# Patient Record
Sex: Male | Born: 1980 | Race: White | Hispanic: No | Marital: Married | Smoking: Current some day smoker
Health system: Southern US, Community
[De-identification: ages and names within clinical notes are randomized; demographics above are authoritative.]

## PROBLEM LIST (undated history)

## (undated) HISTORY — PX: VASECTOMY: SHX75

---

## 2009-04-06 ENCOUNTER — Ambulatory Visit: Payer: Self-pay | Admitting: Family Medicine

## 2009-04-06 DIAGNOSIS — J3089 Other allergic rhinitis: Secondary | ICD-10-CM

## 2009-04-06 DIAGNOSIS — J0100 Acute maxillary sinusitis, unspecified: Secondary | ICD-10-CM | POA: Insufficient documentation

## 2010-01-03 ENCOUNTER — Ambulatory Visit: Payer: Self-pay | Admitting: Occupational Medicine

## 2010-01-03 DIAGNOSIS — H01009 Unspecified blepharitis unspecified eye, unspecified eyelid: Secondary | ICD-10-CM | POA: Insufficient documentation

## 2010-09-19 ENCOUNTER — Ambulatory Visit: Payer: Self-pay | Admitting: Emergency Medicine

## 2010-11-29 NOTE — Assessment & Plan Note (Signed)
Summary: POSSIBLE SINUS INFECTION   Vital Signs:  Patient Profile:   30 Years Old Male CC:      Cold & URI symptoms Height:     73 inches Weight:      232 pounds O2 Sat:      97 % O2 treatment:    Room Air Temp:     98.5 degrees F oral Pulse rate:   93 / minute Pulse rhythm:   irregular Resp:     16 per minute BP sitting:   96 / 63  (left arm) Cuff size:   regular  Vitals Entered By: Areta Haber CMA (September 19, 2010 1:56 PM)                  Current Allergies: No known allergies History of Present Illness History from: patient Chief Complaint: Cold & URI symptoms History of Present Illness: Patient complains of onset of cold symptoms for 3 weeks, but worsening last 4 days.  They have been using Mucinex, Rob-DM, and Goody powder which is helping a little bit.  He is also asking to get back on his Advair and Albuterol since he's been having asthma and allergy symptoms since he moved to [place]. + sore throat No cough No pleuritic pain + wheezing + nasal congestion + post-nasal drainage + sinus pain/pressure No itchy/red eyes No earache No hemoptysis + SOB No chills/sweats No fever No nausea No vomiting No abdominal pain No diarrhea No skin rashes No fatigue No myalgias No headache   Current Problems: BLEPHARITIS, RIGHT (ICD-373.00) ACUTE MAXILLARY SINUSITIS (ICD-461.0) ALLERGIC RHINITIS (ICD-477.9)   Current Meds IBUPROFEN 200 MG TABS (IBUPROFEN) as directed TYLENOL 325 MG TABS (ACETAMINOPHEN) prn ROBITUSSIN MAXIMUM STRENGTH 15 MG/5ML SYRP (DEXTROMETHORPHAN HBR) as directed MUCINEX DM 30-600 MG XR12H-TAB (DEXTROMETHORPHAN-GUAIFENESIN) as directed GOODYS EXTRA STRENGTH 260-130-16.25 MG TABS (ASPIRIN-ACETAMINOPHEN-CAFFEINE) as directed VITAMIN C 1000 MG TABS (ASCORBIC ACID) as directed AMOXICILLIN 875 MG TABS (AMOXICILLIN) 1 tab by mouth two times a day for 10 days PREDNISONE (PAK) 10 MG TABS (PREDNISONE) use as directed (6 day  pack) CHERATUSSIN AC 100-10 MG/5ML SYRP (GUAIFENESIN-CODEINE) 1 tsp by mouth Q6 hrs as needed for cough VENTOLIN HFA 108 (90 BASE) MCG/ACT AERS (ALBUTEROL SULFATE) 1-2 puffs Q6 hours as needed for wheezing, shortness of breath ADVAIR DISKUS 250-50 MCG/DOSE AEPB (FLUTICASONE-SALMETEROL) 1 puff BID  REVIEW OF SYSTEMS Constitutional Symptoms       Complains of fever.     Denies chills, night sweats, weight loss, weight gain, and fatigue.  Eyes       Denies change in vision, eye pain, eye discharge, glasses, contact lenses, and eye surgery. Ear/Nose/Throat/Mouth       Complains of frequent runny nose, sinus problems, sore throat, and hoarseness.      Denies hearing loss/aids, change in hearing, ear pain, ear discharge, dizziness, frequent nose bleeds, and tooth pain or bleeding.      Comments: x 4 dys  Respiratory       Denies dry cough, productive cough, wheezing, shortness of breath, asthma, bronchitis, and emphysema/COPD.      Comments: chest congestion Cardiovascular       Denies murmurs, chest pain, and tires easily with exhertion.    Gastrointestinal       Complains of nausea/vomiting.      Denies stomach pain, diarrhea, constipation, blood in bowel movements, and indigestion. Genitourniary       Denies painful urination, kidney stones, and loss of urinary control. Neurological  Complains of headaches.      Denies paralysis, seizures, and fainting/blackouts. Musculoskeletal       Complains of muscle pain and joint pain.      Denies joint stiffness, decreased range of motion, redness, swelling, muscle weakness, and gout.  Skin       Denies bruising, unusual mles/lumps or sores, and hair/skin or nail changes.  Psych       Denies mood changes, temper/anger issues, anxiety/stress, speech problems, depression, and sleep problems. Other Comments: Pt does not have PCP. Another card given.   Past History:  Past Medical History: Last updated: 04/06/2009 Allergic rhinitis  Past  Surgical History: Last updated: 04/06/2009 Denies surgical history  Family History: Last updated: 04/06/2009 Allergies  Social History: Last updated: 04/06/2009 Married Never Smoked - dip 1 can daily Alcohol use-yes - six pack every other week Drug use-no Regular exercise-yes  Risk Factors: Exercise: yes (04/06/2009)  Risk Factors: Smoking Status: never (04/06/2009) Physical Exam General appearance: well developed, well nourished, no acute distress Ears: normal, no lesions or deformities Nasal: mucosa pink, nonedematous, no septal deviation, turbinates normal Oral/Pharynx: pharyngeal erythema without exudate, uvula midline without deviation Neck: neck supple,  trachea midline, no masses Chest/Lungs: no rales, wheezes, or rhonchi bilateral, breath sounds equal without effort Heart: regular rate and  rhythm, no murmur Skin: no obvious rashes or lesions MSE: oriented to time, place, and person  Patient Education: Patient and/or caregiver instructed in the following: rest, fluids.  Plan New Medications/Changes: ADVAIR DISKUS 250-50 MCG/DOSE AEPB (FLUTICASONE-SALMETEROL) 1 puff BID  #1 x 1, 09/19/2010, Hoyt Koch MD VENTOLIN HFA 108 (90 BASE) MCG/ACT AERS (ALBUTEROL SULFATE) 1-2 puffs Q6 hours as needed for wheezing, shortness of breath  #1 x 1, 09/19/2010, Hoyt Koch MD CHERATUSSIN AC 100-10 MG/5ML SYRP (GUAIFENESIN-CODEINE) 1 tsp by mouth Q6 hrs as needed for cough  #6oz x 0, 09/19/2010, Hoyt Koch MD PREDNISONE (PAK) 10 MG TABS (PREDNISONE) use as directed (6 day pack)  #1 pack x 0, 09/19/2010, Hoyt Koch MD AMOXICILLIN 875 MG TABS (AMOXICILLIN) 1 tab by mouth two times a day for 10 days  #20 x 0, 09/19/2010, Hoyt Koch MD  New Orders: Est. Patient Level IV [16109] Planning Comments:   Rx for Prednisone + Amoxicillin + Cheratussin for the current illness.  Also should take Sudafed 12-hour for the next few days to help control his  symptoms.  He should improve over the next week.   I have also given him 2 months worth of Advair + Albuterol.  However since this is a chronic medical problem, he should be followed by a PCP.  I have given him info about finding a new PCP to manage these problems.   The patient and/or caregiver has been counseled thoroughly with regard to medications prescribed including dosage, schedule, interactions, rationale for use, and possible side effects and they verbalize understanding.  Diagnoses and expected course of recovery discussed and will return if not improved as expected or if the condition worsens. Patient and/or caregiver verbalized understanding.  Prescriptions: ADVAIR DISKUS 250-50 MCG/DOSE AEPB (FLUTICASONE-SALMETEROL) 1 puff BID  #1 x 1   Entered and Authorized by:   Hoyt Koch MD   Signed by:   Hoyt Koch MD on 09/19/2010   Method used:   Print then Give to Patient   RxID:   [id number] VENTOLIN HFA 108 (90 BASE) MCG/ACT AERS (ALBUTEROL SULFATE) 1-2 puffs Q6 hours as needed for wheezing, shortness of breath  #1 x 1   Entered  and Authorized by:   Hoyt Koch MD   Signed by:   Hoyt Koch MD on 09/19/2010   Method used:   Print then Give to Patient   RxID:   [id number] CHERATUSSIN AC 100-10 MG/5ML SYRP (GUAIFENESIN-CODEINE) 1 tsp by mouth Q6 hrs as needed for cough  #6oz x 0   Entered and Authorized by:   Hoyt Koch MD   Signed by:   Hoyt Koch MD on 09/19/2010   Method used:   Print then Give to Patient   RxID:   [id number] PREDNISONE (PAK) 10 MG TABS (PREDNISONE) use as directed (6 day pack)  #1 pack x 0   Entered and Authorized by:   Hoyt Koch MD   Signed by:   Hoyt Koch MD on 09/19/2010   Method used:   Print then Give to Patient   RxID:   [id number] AMOXICILLIN 875 MG TABS (AMOXICILLIN) 1 tab by mouth two times a day for 10 days  #20 x 0   Entered and Authorized by:   Hoyt Koch MD    Signed by:   Hoyt Koch MD on 09/19/2010   Method used:   Print then Give to Patient   RxID:   [id number]   Orders Added: 1)  Est. Patient Level IV [26712]

## 2010-11-29 NOTE — Assessment & Plan Note (Signed)
Summary: EYE PAIN/KH   Vital Signs:  Patient Profile:   30 Years Old Male CC:      Right eye redness and pain x 4 days Height:     73 inches Weight:      235 pounds O2 Sat:      98 % O2 treatment:    Room Air Temp:     98.2 degrees F oral Pulse rate:   59 / minute Pulse rhythm:   regular Resp:     20 per minute BP sitting:   140 / 73  (right arm) Cuff size:   regular  Vitals Entered By: Emilio Math (January 03, 2010 9:07 AM)              Vision Screening: Left eye w/o correction: 20 / 20 Right Eye w/o correction: 20 / 40 Both eyes w/o correction:  20/ 20          Current Allergies (reviewed today): No known allergies History of Present Illness Chief Complaint: Right eye redness and pain x 4 days History of Present Illness: Presents with complaints of mildly painful irritated upper eyelid on the right.   He has crusting in the mornings from his right eye.  No conjunctival injection.   No vision changes.   No allergy symptoms.    REVIEW OF SYSTEMS Constitutional Symptoms      Denies fever, chills, night sweats, weight loss, weight gain, and fatigue.  Eyes       Complains of change in vision, eye pain, eye drainage, and glasses.      Denies contact lenses and eye surgery. Ear/Nose/Throat/Mouth       Denies hearing loss/aids, change in hearing, ear pain, ear discharge, dizziness, frequent runny nose, frequent nose bleeds, sinus problems, sore throat, hoarseness, and tooth pain or bleeding.  Respiratory       Denies dry cough, productive cough, wheezing, shortness of breath, asthma, bronchitis, and emphysema/COPD.  Cardiovascular       Denies murmurs, chest pain, and tires easily with exhertion.    Gastrointestinal       Denies stomach pain, nausea/vomiting, diarrhea, constipation, blood in bowel movements, and indigestion. Genitourniary       Denies painful urination, kidney stones, and loss of urinary control. Neurological       Denies paralysis, seizures, and  fainting/blackouts. Musculoskeletal       Denies muscle pain, joint pain, joint stiffness, decreased range of motion, redness, swelling, muscle weakness, and gout.  Skin       Denies bruising, unusual mles/lumps or sores, and hair/skin or nail changes.  Psych       Denies mood changes, temper/anger issues, anxiety/stress, speech problems, depression, and sleep problems.  Past History:  Past Medical History: Reviewed history from 04/06/2009 and no changes required. Allergic rhinitis  Past Surgical History: Reviewed history from 04/06/2009 and no changes required. Denies surgical history  Family History: Reviewed history from 04/06/2009 and no changes required. Allergies  Social History: Reviewed history from 04/06/2009 and no changes required. Married Never Smoked - dip 1 can daily Alcohol use-yes - six pack every other week Drug use-no Regular exercise-yes Physical Exam General appearance: well developed, well nourished, no acute distress Eyes: normal conjuctival and cornea.   Swollen uper eyelid with yellow crust.   No evidence of stye.   Pupils: equal, round, reactive to light Ears: normal, no lesions or deformities Chest/Lungs: no rales, wheezes, or rhonchi bilateral, breath sounds equal without effort Heart: regular rate  and  rhythm, no murmur Assessment New Problems: BLEPHARITIS, RIGHT (ICD-373.00)   Plan New Medications/Changes: BACITRACIN 500 UNIT/GM OINT (BACITRACIN) apply a ribbon of ointment in lower lid twice a day for 7 days for eye infection.  #1 tube x 0, 01/03/2010, Kathrine Haddock MD  New Orders: Est. Patient Level III [phone] Planning Comments:   eye hygine instructions given Bacitracin ointment to right eye twice a day for 1 week Follow up if no better in 5 days.   The patient and/or caregiver has been counseled thoroughly with regard to medications prescribed including dosage, schedule, interactions, rationale for use, and possible side effects  and they verbalize understanding.  Diagnoses and expected course of recovery discussed and will return if not improved as expected or if the condition worsens. Patient and/or caregiver verbalized understanding.  Prescriptions: BACITRACIN 500 UNIT/GM OINT (BACITRACIN) apply a ribbon of ointment in lower lid twice a day for 7 days for eye infection.  #1 tube x 0   Entered and Authorized by:   Kathrine Haddock MD   Signed by:   Kathrine Haddock MD on 01/03/2010   Method used:   Electronically to        [facility]  [address] [id number] (retail)       [address]       [place], [place]  [address]       Ph: [phone] or [phone]       Fax: [phone]   RxID:   [id number]

## 2011-08-17 ENCOUNTER — Inpatient Hospital Stay (INDEPENDENT_AMBULATORY_CARE_PROVIDER_SITE_OTHER)
Admission: RE | Admit: 2011-08-17 | Discharge: 2011-08-17 | Disposition: A | Discharge status: Home / Self Care | Payer: BC Managed Care – PPO | Source: Ambulatory Visit | Attending: Family Medicine | Admitting: Family Medicine

## 2011-08-17 ENCOUNTER — Encounter: Payer: Self-pay | Admitting: Family Medicine

## 2011-08-17 DIAGNOSIS — J069 Acute upper respiratory infection, unspecified: Secondary | ICD-10-CM

## 2011-08-17 DIAGNOSIS — H6690 Otitis media, unspecified, unspecified ear: Secondary | ICD-10-CM

## 2011-10-02 NOTE — Progress Notes (Signed)
Summary: COUGH,SOB,HEADACHE,SORE THROAT...WSE Room 4   Vital Signs:  Patient Profile:   30 Years Old Male CC:      Dry cough, congestion, fatigue, fever x 5 days Height:     73 inches Weight:      233 pounds O2 Sat:      98 % O2 treatment:    Room Air Temp:     99.0 degrees F oral Pulse rate:   70 / minute Pulse rhythm:   regular Resp:     16 per minute BP sitting:   133 / 77  (left arm) Cuff size:   regular  Vitals Entered By: Emilio Math (August 17, 2011 3:05 PM)                  Current Allergies: No known allergies History of Present Illness Chief Complaint: Dry cough, congestion, fatigue, fever x 5 days History of Present Illness:  Subjective: Patient complains of URI symptoms that started 4 days ago. + mild sore throat, improved + cough for two days, non-productive No pleuritic pain No wheezing + nasal congestion + post-nasal drainage No sinus pain/pressure No itchy/red eyes ? left earache No hemoptysis No SOB + fever/chills No nausea No vomiting No abdominal pain No diarrhea No skin rashes +fatigue No myalgias No headache Used OTC meds without relief   Current Meds IBUPROFEN 200 MG TABS (IBUPROFEN) as directed TYLENOL 325 MG TABS (ACETAMINOPHEN) prn GOODYS EXTRA STRENGTH 260-130-16.25 MG TABS (ASPIRIN-ACETAMINOPHEN-CAFFEINE) as directed VITAMIN C 1000 MG TABS (ASCORBIC ACID) as directed AMOXICILLIN 875 MG TABS (AMOXICILLIN) One by mouth two times a day BENZONATATE 200 MG CAPS (BENZONATATE) One by mouth hs as needed cough  REVIEW OF SYSTEMS Constitutional Symptoms       Complains of fever and fatigue.     Denies chills, night sweats, weight loss, and weight gain.  Eyes       Denies change in vision, eye pain, eye discharge, glasses, contact lenses, and eye surgery. Ear/Nose/Throat/Mouth       Complains of ear pain, frequent runny nose, and sinus problems.      Denies hearing loss/aids, change in hearing, ear discharge, dizziness, frequent  nose bleeds, sore throat, hoarseness, and tooth pain or bleeding.  Respiratory       Complains of dry cough.      Denies productive cough, wheezing, shortness of breath, asthma, bronchitis, and emphysema/COPD.  Cardiovascular       Denies murmurs, chest pain, and tires easily with exhertion.    Gastrointestinal       Denies stomach pain, nausea/vomiting, diarrhea, constipation, blood in bowel movements, and indigestion. Genitourniary       Denies painful urination, kidney stones, and loss of urinary control. Neurological       Complains of headaches.      Denies paralysis, seizures, and fainting/blackouts. Musculoskeletal       Denies muscle pain, joint pain, joint stiffness, decreased range of motion, redness, swelling, muscle weakness, and gout.  Skin       Denies bruising, unusual mles/lumps or sores, and hair/skin or nail changes.  Psych       Denies mood changes, temper/anger issues, anxiety/stress, speech problems, depression, and sleep problems.  Past History:  Past Medical History: Reviewed history from 04/06/2009 and no changes required. Allergic rhinitis  Past Surgical History: Reviewed history from 04/06/2009 and no changes required. Denies surgical history  Family History: Allergies-Father Mother, Healthy   Objective:  Appearance:  Patient appears healthy, stated age, and  in no acute distress  Eyes:  Pupils are equal, round, and reactive to light and accomodation.  Extraocular movement is intact.  Conjunctivae are not inflamed.  Ears:  Canals normal.  Right tympanic membrane normal.  Left tympanic membrane pink. Nose:  Mildly congested turbinates, worse on left.  No sinus tenderness  Pharynx:  Normal  Neck:  Supple. Tender shotty posterior nodes are palpated bilaterally.  Lungs:  Clear to auscultation.  Breath sounds are equal.  Heart:  Regular rate and rhythm without murmurs, rubs, or gallops.  Abdomen:  Nontender without masses or hepatosplenomegaly.  Bowel  sounds are present.  No CVA or flank tenderness.  Skin:  No rash Assessment New Problems: OTITIS MEDIA, ACUTE, LEFT (ICD-382.9) UPPER RESPIRATORY INFECTION, ACUTE (ICD-465.9)  VIRAL URI WITH LEFT OTITIS MEDIA  Plan New Medications/Changes: BENZONATATE 200 MG CAPS (BENZONATATE) One by mouth hs as needed cough  #12 x 0, 08/17/2011, Donna Christen MD AMOXICILLIN 875 MG TABS (AMOXICILLIN) One by mouth two times a day  #20 x 0, 08/17/2011, Donna Christen MD  New Orders: Est. Patient Level IV [16109] Pulse Oximetry (single measurment) [60454] Planning Comments:   Begin amoxicillin, expectorant/decongestant, cough suppressant at bedtime.  Increase fluid intake Followup with PCP if not improving 7 to 10 days   The patient and/or caregiver has been counseled thoroughly with regard to medications prescribed including dosage, schedule, interactions, rationale for use, and possible side effects and they verbalize understanding.  Diagnoses and expected course of recovery discussed and will return if not improved as expected or if the condition worsens. Patient and/or caregiver verbalized understanding.  Prescriptions: BENZONATATE 200 MG CAPS (BENZONATATE) One by mouth hs as needed cough  #12 x 0   Entered and Authorized by:   Donna Christen MD   Signed by:   Donna Christen MD on 08/17/2011   Method used:   Print then Give to Patient   RxID:   [id number] AMOXICILLIN 875 MG TABS (AMOXICILLIN) One by mouth two times a day  #20 x 0   Entered and Authorized by:   Donna Christen MD   Signed by:   Donna Christen MD on 08/17/2011   Method used:   Print then Give to Patient   RxID:   [id number]   Patient Instructions: 1)  Take Mucinex D (guaifenesin with decongestant) twice daily for congestion. 2)  Increase fluid intake, rest. 3)  Stop Daquil and Nyquil 4)  May use Afrin nasal spray (or generic oxymetazoline) twice daily for about 5 days.  Also recommend using saline nasal spray several  times daily and/or saline nasal irrigation. 5)  Followup with family doctor if not improving 7 to 10 days.   Orders Added: 1)  Est. Patient Level IV [41324] 2)  Pulse Oximetry (single measurment) [40102]

## 2012-11-13 ENCOUNTER — Emergency Department (INDEPENDENT_AMBULATORY_CARE_PROVIDER_SITE_OTHER)
Admission: EM | Admit: 2012-11-13 | Discharge: 2012-11-13 | Disposition: A | Discharge status: Home / Self Care | Payer: BC Managed Care – PPO | Source: Home / Self Care | Attending: Family Medicine | Admitting: Family Medicine

## 2012-11-13 ENCOUNTER — Encounter: Payer: Self-pay | Admitting: *Deleted

## 2012-11-13 ENCOUNTER — Emergency Department (INDEPENDENT_AMBULATORY_CARE_PROVIDER_SITE_OTHER): Payer: BC Managed Care – PPO

## 2012-11-13 DIAGNOSIS — K5900 Constipation, unspecified: Secondary | ICD-10-CM

## 2012-11-13 DIAGNOSIS — Z800 Family history of malignant neoplasm of digestive organs: Secondary | ICD-10-CM

## 2012-11-13 MED ORDER — POLYETHYLENE GLYCOL 3350 17 GM/SCOOP PO POWD: 17.0000 g | Freq: Every day | ORAL | Status: DC

## 2012-11-13 NOTE — ED Provider Notes (Signed)
History     CSN: [id number]  Arrival date & time 11/13/12  1454   First MD Initiated Contact with Patient 11/13/12 1516      Chief Complaint  Patient presents with  . Constipation      HPI Comments: Patient complains of 3 week history of intermittent constipation (small stools and must strain).  Stool frequency slightly decreased.  Feels mild abdominal discomfort but no pain.  No melena or hematochezia.  No fevers, chills, and sweats.  No weight loss.  No nausea/vomiting.  He admits that he and his wife have changed their diet over the past two months (now eating Weight Watchers). Family history of colon cancer in paternal GF, and colon polyps in his father.                                                                                                                                                                                                                                                                                                                                                                                                                                                                          Patient is a 32 y.o. male presenting with constipation. The history is provided by the patient.  Constipation  Episode onset: 3 weeks ago. The problem has been unchanged. The pain is mild. The stool is described as hard. There was no prior successful therapy. Associated symptoms include abdominal pain. Pertinent negatives include no anorexia, no fever, no diarrhea, no hematemesis, no hemorrhoids, no nausea, no rectal pain, no vomiting, no hematuria, no chest pain, no headaches, no coughing, no difficulty breathing and no rash. His past medical history is significant for recent change in diet. His past medical history does not include inflammatory bowel disease, recent abdominal injury, recent antibiotic use or a recent illness. There were no sick contacts. He has received no recent medical care.     History reviewed. No pertinent past medical history.  History reviewed. No pertinent past surgical history.  Family History  Problem Relation Age of Onset  . Colon polyps Father   . Cancer Other     colon CA    History  Substance Use Topics  . Smoking status: Former Games developer  . Smokeless tobacco: Not on file  . Alcohol Use: Yes      Review of Systems  Constitutional: Negative for fever.  Respiratory: Negative for cough.   Cardiovascular: Negative for chest pain.  Gastrointestinal: Positive for abdominal pain and constipation. Negative for nausea, vomiting, diarrhea, rectal pain, anorexia, hematemesis and hemorrhoids.  Genitourinary: Negative for hematuria.  Skin: Negative for rash.  Neurological: Negative for headaches.  All other systems reviewed and are negative.    Allergies  Review of patient's allergies indicates no known allergies.  Home Medications   Current Outpatient Rx  Name  Route  Sig  Dispense  Refill  . POLYETHYLENE GLYCOL 3350 PO POWD   Oral   Take 17 g by mouth daily. Mix in 4 - 8 oz of liquid.   255 g   0     BP 123/74  Pulse 78  Temp 98.5 F (36.9 C) (Oral)  Resp 14  Ht 6\' 1"  (1.854 m)  Wt 205 lb (92.987 kg)  BMI 27.05 kg/m2  SpO2 97%  Physical Exam Nursing notes and Vital Signs reviewed. Appearance:  Patient appears healthy, stated age, and in no acute distress Eyes:  Pupils are equal, round, and reactive to light and accomodation.  Extraocular movement is intact.  Conjunctivae are not inflamed  Pharynx:  Normal Neck:  Supple.  No adenopathy or thyromegaly Lungs:  Clear to auscultation.  Breath sounds are equal.  Heart:  Regular rate and rhythm without murmurs, rubs, or gallops.  Abdomen:   Mild diffuse tenderness over colon without masses or hepatosplenomegaly.  Bowel sounds are present.  No CVA or flank tenderness.  Extremities:  No edema.  No calf tenderness Skin:  No rash present.   ED Course  Procedures  none   Labs  Reviewed  TSH pending   Dg Abd 1 View  11/13/2012  *RADIOLOGY REPORT*  Clinical Data: Constipation  ABDOMEN - 1 VIEW  Comparison: None.  Findings:  There is moderate stool throughout the colon.  Overall, the bowel gas pattern is unremarkable.  No obstruction or free air is seen on this supine examination.  There are no abnormal calcifications.  Impression:  Nonspecific gas pattern.  Moderate stool throughout colon.   Original Report Authenticated By: Bretta Bang, M.D.      1. Constipation   2. Family history of colon cancer       MDM  Return Hemoccult cards (X3).  Check TSH Rx given for Miralax daily. Increase fluid intake.  Increase  daily fiber intake, including vegetables and fruits. Begin Citrucel capsules, two caps once or twice daily with 8 oz water. Return Hemocult card tests. Followup with GI if not improving one month (or if Hemoccults positive)        Lattie Haw, MD 11/13/12 [phone]

## 2012-11-13 NOTE — ED Notes (Signed)
Patient c/o constipation/ difficulty having bowel movements. His last BM was this AM. Denies abdominal pain or blood in stool. Since this difficulty started he has increased his water consumption.

## 2012-11-14 LAB — TSH: TSH: 2.204 u[IU]/mL (ref 0.350–4.500)

## 2012-11-15 ENCOUNTER — Telehealth: Payer: Self-pay | Admitting: Emergency Medicine

## 2013-01-14 ENCOUNTER — Encounter: Payer: Self-pay | Admitting: *Deleted

## 2013-01-14 ENCOUNTER — Emergency Department (INDEPENDENT_AMBULATORY_CARE_PROVIDER_SITE_OTHER)
Admission: EM | Admit: 2013-01-14 | Discharge: 2013-01-14 | Disposition: A | Discharge status: Home / Self Care | Payer: BC Managed Care – PPO | Source: Home / Self Care | Attending: Family Medicine | Admitting: Family Medicine

## 2013-01-14 DIAGNOSIS — J069 Acute upper respiratory infection, unspecified: Secondary | ICD-10-CM

## 2013-01-14 DIAGNOSIS — J309 Allergic rhinitis, unspecified: Secondary | ICD-10-CM

## 2013-01-14 DIAGNOSIS — R062 Wheezing: Secondary | ICD-10-CM

## 2013-01-14 MED ORDER — FLUTICASONE PROPIONATE 50 MCG/ACT NA SUSP: 2.0000 | Freq: Every day | NASAL | Status: DC

## 2013-01-14 MED ORDER — METHYLPREDNISOLONE ACETATE 80 MG/ML IJ SUSP
80.0000 mg | Freq: Once | INTRAMUSCULAR | Status: AC
  Administered 2013-01-14: 80 mg via INTRAMUSCULAR

## 2013-01-14 MED ORDER — CETIRIZINE HCL 10 MG PO CAPS: 10.0000 mg | ORAL_CAPSULE | Freq: Every day | ORAL | Status: DC

## 2013-01-14 MED ORDER — AZITHROMYCIN 250 MG PO TABS: ORAL_TABLET | ORAL | Status: DC

## 2013-01-14 NOTE — ED Notes (Signed)
[place]ing c/o 3-4 days of cough, congestion, sneezing and runny nose. Taken tylenol and generic mucinex OTC. Denies fever.

## 2013-01-14 NOTE — ED Provider Notes (Signed)
History     CSN: [id number]  Arrival date & time 01/14/13  1112   First MD Initiated Contact with Patient 01/14/13 1114      Chief Complaint  Patient presents with  . URI   HPI  URI Symptoms  Onset: 4-5 days  Description: sinus pressure, nasal congestion, cough, mild wheezing,  Modifying factors:  Former smoker (quit 8 years ago). Multiple sick contacts with similar sxs.   Symptoms Nasal discharge: yes Fever: no Sore throat: no Cough: yes Wheezing: yes Ear pain: mild GI symptoms: no Sick contacts: yes  Red Flags  Stiff neck: no Dyspnea: faint Rash: no Swallowing difficulty: no  Sinusitis Risk Factors Headache/face pain: mild Double sickening: no tooth pain: no  Allergy Risk Factors Sneezing: yes Itchy scratchy throat: yes Seasonal symptoms: yes  Flu Risk Factors Headache: no muscle aches: no severe fatigue: no   History reviewed. No pertinent past medical history.  History reviewed. No pertinent past surgical history.  Family History  Problem Relation Age of Onset  . Colon polyps Father   . Cancer Other     colon CA    History  Substance Use Topics  . Smoking status: Former Games developer  . Smokeless tobacco: Not on file  . Alcohol Use: Yes      Review of Systems  All other systems reviewed and are negative.    Allergies  Review of patient's allergies indicates no known allergies.  Home Medications   Current Outpatient Rx  Name  Route  Sig  Dispense  Refill  . azithromycin (ZITHROMAX) 250 MG tablet      Take 2 tabs PO x 1 dose, then 1 tab PO QD x 4 days   6 tablet   0   . Cetirizine HCl 10 MG CAPS   Oral   Take 1 capsule (10 mg total) by mouth daily.   30 capsule   3   . fluticasone (FLONASE) 50 MCG/ACT nasal spray   Nasal   Place 2 sprays into the nose daily.   16 g   12     BP 132/77  Pulse 74  Temp(Src) 98.2 F (36.8 C) (Oral)  Ht 6\' 1"  (1.854 m)  Wt 198 lb (89.812 kg)  BMI 26.13 kg/m2  SpO2 97%  Physical Exam   Constitutional: He appears well-developed and well-nourished.  HENT:  Head: Normocephalic and atraumatic.  Right Ear: External ear normal.  Left Ear: External ear normal.  +nasal erythema, rhinorrhea bilaterally, + post oropharyngeal erythema    Eyes: Conjunctivae are normal. Pupils are equal, round, and reactive to light.  Neck: Normal range of motion. Neck supple.  Cardiovascular: Normal rate, regular rhythm and normal heart sounds.   Pulmonary/Chest: Effort normal.  Abdominal: Soft.  Musculoskeletal: Normal range of motion.  Lymphadenopathy:    He has no cervical adenopathy.  Neurological: He is alert.  Skin: Skin is warm.    ED Course  Procedures (including critical care time)  Labs Reviewed - No data to display No results found.   1. URI (upper respiratory infection)   2. Allergic rhinitis   3. Wheezing       MDM  depomedrol 80mg  IM x1 for wheezing.  flonase and zyrtec for allergic component.  Zpak for atypical coverage.  Discussed supportive care and infectious red flags.  Follow up as needed.      The patient and/or caregiver has been counseled thoroughly with regard to treatment plan and/or medications prescribed including dosage, schedule, interactions, rationale  for use, and possible side effects and they verbalize understanding. Diagnoses and expected course of recovery discussed and will return if not improved as expected or if the condition worsens. Patient and/or caregiver verbalized understanding.             Doree Albee, MD 01/14/13 1135

## 2013-01-17 ENCOUNTER — Telehealth: Payer: Self-pay | Admitting: Emergency Medicine

## 2015-08-20 ENCOUNTER — Emergency Department
Admission: EM | Admit: 2015-08-20 | Discharge: 2015-08-20 | Disposition: A | Discharge status: Home / Self Care | Payer: BLUE CROSS/BLUE SHIELD | Source: Home / Self Care | Attending: Family Medicine | Admitting: Family Medicine

## 2015-08-20 ENCOUNTER — Encounter: Payer: Self-pay | Admitting: Emergency Medicine

## 2015-08-20 ENCOUNTER — Emergency Department (INDEPENDENT_AMBULATORY_CARE_PROVIDER_SITE_OTHER): Payer: BLUE CROSS/BLUE SHIELD

## 2015-08-20 DIAGNOSIS — R0989 Other specified symptoms and signs involving the circulatory and respiratory systems: Secondary | ICD-10-CM

## 2015-08-20 DIAGNOSIS — R05 Cough: Secondary | ICD-10-CM

## 2015-08-20 DIAGNOSIS — R053 Chronic cough: Secondary | ICD-10-CM

## 2015-08-20 DIAGNOSIS — J069 Acute upper respiratory infection, unspecified: Secondary | ICD-10-CM

## 2015-08-20 MED ORDER — AZITHROMYCIN 250 MG PO TABS
250.0000 mg | ORAL_TABLET | Freq: Every day | ORAL | Status: DC
Start: 2015-08-20 — End: 2017-08-27

## 2015-08-20 MED ORDER — BENZONATATE 100 MG PO CAPS: 100.0000 mg | ORAL_CAPSULE | Freq: Three times a day (TID) | ORAL | Status: DC

## 2015-08-20 MED ORDER — DM-GUAIFENESIN ER 30-600 MG PO TB12: 1.0000 | ORAL_TABLET | Freq: Two times a day (BID) | ORAL | Status: DC

## 2015-08-20 NOTE — ED Notes (Signed)
Pt here c/o cough with mucous and chest congestion x1 month. States he has had intermittent fever. Cough is worse at night. He is not taking any medication at this time.

## 2015-08-20 NOTE — ED Notes (Signed)
Phoned in 3 rx's per pt request. [facility] [address]

## 2015-08-20 NOTE — Discharge Instructions (Signed)
Please take antibiotics as prescribed and be sure to complete entire course even if you start to feel better to ensure infection does not come back. ° ° °You may take 400-600mg Ibuprofen (Motrin) every 6-8 hours for fever and pain  °Alternate with Tylenol  °You may take 500mg Tylenol every 4-6 hours as needed for fever and pain  °Follow-up with your primary care provider next week for recheck of symptoms if not improving.  °Be sure to drink plenty of fluids and rest, at least 8hrs of sleep a night, preferably more while you are sick. °Return urgent care or go to closest ER if you cannot keep down fluids/signs of dehydration, fever not reducing with Tylenol, difficulty breathing/wheezing, stiff neck, worsening condition, or other concerns (see below)  ° °

## 2015-08-20 NOTE — ED Provider Notes (Signed)
CSN: [id number]     Arrival date & time 08/20/15  1506 History   First MD Initiated Contact with Patient 08/20/15 1507     Chief Complaint  Patient presents with  . Cough   (Consider location/radiation/quality/duration/timing/severity/associated sxs/prior Treatment) HPI  Pt is a 34yo male presenting to [facility] with c/o persistent dry but occasionally productive moderately intermittent cough for 1 month.  Now he has subjective fever with hot and cold chills as well as body aches. Cough is worse at night. He states he thought he had a cold at first since he was starting to get better but cough is now worse than before. Pt states his young daughter was sick earlier this week but seems to be doing better. Denies n/v/d. Denies chest pain or SOB at this time. Denies hx of asthma.   History reviewed. No pertinent past medical history. History reviewed. No pertinent past surgical history. Family History  Problem Relation Age of Onset  . Colon polyps Father   . Cancer Other     colon CA   Social History  Substance Use Topics  . Smoking status: Current Every Day Smoker -- 1.00 packs/day    Types: Cigarettes  . Smokeless tobacco: None  . Alcohol Use: Yes    Review of Systems  Constitutional: Positive for fever and chills.  HENT: Positive for congestion. Negative for ear pain, sore throat, trouble swallowing and voice change.   Respiratory: Positive for cough and wheezing. Negative for shortness of breath.   Cardiovascular: Negative for chest pain and palpitations.  Gastrointestinal: Negative for nausea, vomiting, abdominal pain and diarrhea.  Musculoskeletal: Positive for myalgias and arthralgias. Negative for back pain.  Skin: Negative for rash.  All other systems reviewed and are negative.   Allergies  Review of patient's allergies indicates no known allergies.  Home Medications   Prior to Admission medications   Medication Sig Start Date End Date Taking? Authorizing Provider   azithromycin (ZITHROMAX) 250 MG tablet Take 2 tabs PO x 1 dose, then 1 tab PO QD x 4 days 01/14/13   Floydene Flock, MD  azithromycin (ZITHROMAX) 250 MG tablet Take 1 tablet (250 mg total) by mouth daily. Take first 2 tablets together, then 1 every day until finished. 08/20/15   Junius Finner, PA-C  benzonatate (TESSALON) 100 MG capsule Take 1 capsule (100 mg total) by mouth every 8 (eight) hours. 08/20/15   Junius Finner, PA-C  Cetirizine HCl 10 MG CAPS Take 1 capsule (10 mg total) by mouth daily. 01/14/13   Floydene Flock, MD  dextromethorphan-guaiFENesin [facility] DM) 30-600 MG 12hr tablet Take 1 tablet by mouth 2 (two) times daily. 08/20/15   Junius Finner, PA-C  fluticasone (FLONASE) 50 MCG/ACT nasal spray Place 2 sprays into the nose daily. 01/14/13   Floydene Flock, MD   Meds Ordered and Administered this Visit  Medications - No data to display  BP 128/76 mmHg  Pulse 70  Temp(Src) 98.3 F (36.8 C) (Oral)  Wt 237 lb (107.502 kg)  SpO2 97% No data found.   Physical Exam  Constitutional: He appears well-developed and well-nourished.  HENT:  Head: Normocephalic and atraumatic.  Right Ear: External ear normal.  Left Ear: External ear normal.  Nose: Nose normal.  Mouth/Throat: Oropharynx is clear and moist.  Eyes: Conjunctivae are normal. No scleral icterus.  Neck: Normal range of motion. Neck supple.  Cardiovascular: Normal rate, regular rhythm and normal heart sounds.   Pulmonary/Chest: Effort normal and breath sounds normal.  No respiratory distress. He has no wheezes. He has no rales. He exhibits no tenderness.  Intermittent dry cough during exam. No respiratory distress. Lungs: CTAB  Abdominal: Soft. He exhibits no distension and no mass. There is no tenderness. There is no rebound and no guarding.  Musculoskeletal: Normal range of motion.  Neurological: He is alert.  Skin: Skin is warm and dry.  Nursing note and vitals reviewed.   ED Course  Procedures (including  critical care time)  Labs Review Labs Reviewed - No data to display  Imaging Review Dg Chest 2 View  08/20/2015  CLINICAL DATA:  Cough and congestion 3 days. EXAM: CHEST  2 VIEW COMPARISON:  None. FINDINGS: The heart size and mediastinal contours are within normal limits. Both lungs are clear. The visualized skeletal structures are unremarkable. IMPRESSION: No active cardiopulmonary disease. Electronically Signed   By: Elberta Fortisaniel  Boyle M.D.   On: 08/20/2015 15:47       MDM   1. Persistent cough   2. Acute upper respiratory infection    Pt c/o persistent cough for about 1 month, associated URI symptoms including subjective fever and body aches O2 Sat 97% on RA CXR: no pneumothorax, pneumonia or masses  Will start pt on trial of Azithromycin to tx for atypical bacteria Rx: Azithromycin, Tessalon, and mucinex Encouraged lots of fluids and rest F/u with PCP in 1 week if not improving, sooner if worsening. Patient verbalized understanding and agreement with treatment plan.    Junius Finnerrin O'Malley, PA-C 08/20/15 1555

## 2016-12-18 IMAGING — CR DG CHEST 2V
2 series · 2 of 2 positions shown · non-contrast
Comparison: None.

CLINICAL DATA: Cough and congestion 3 days.

EXAM:
CHEST  2 VIEW

[chest pa]
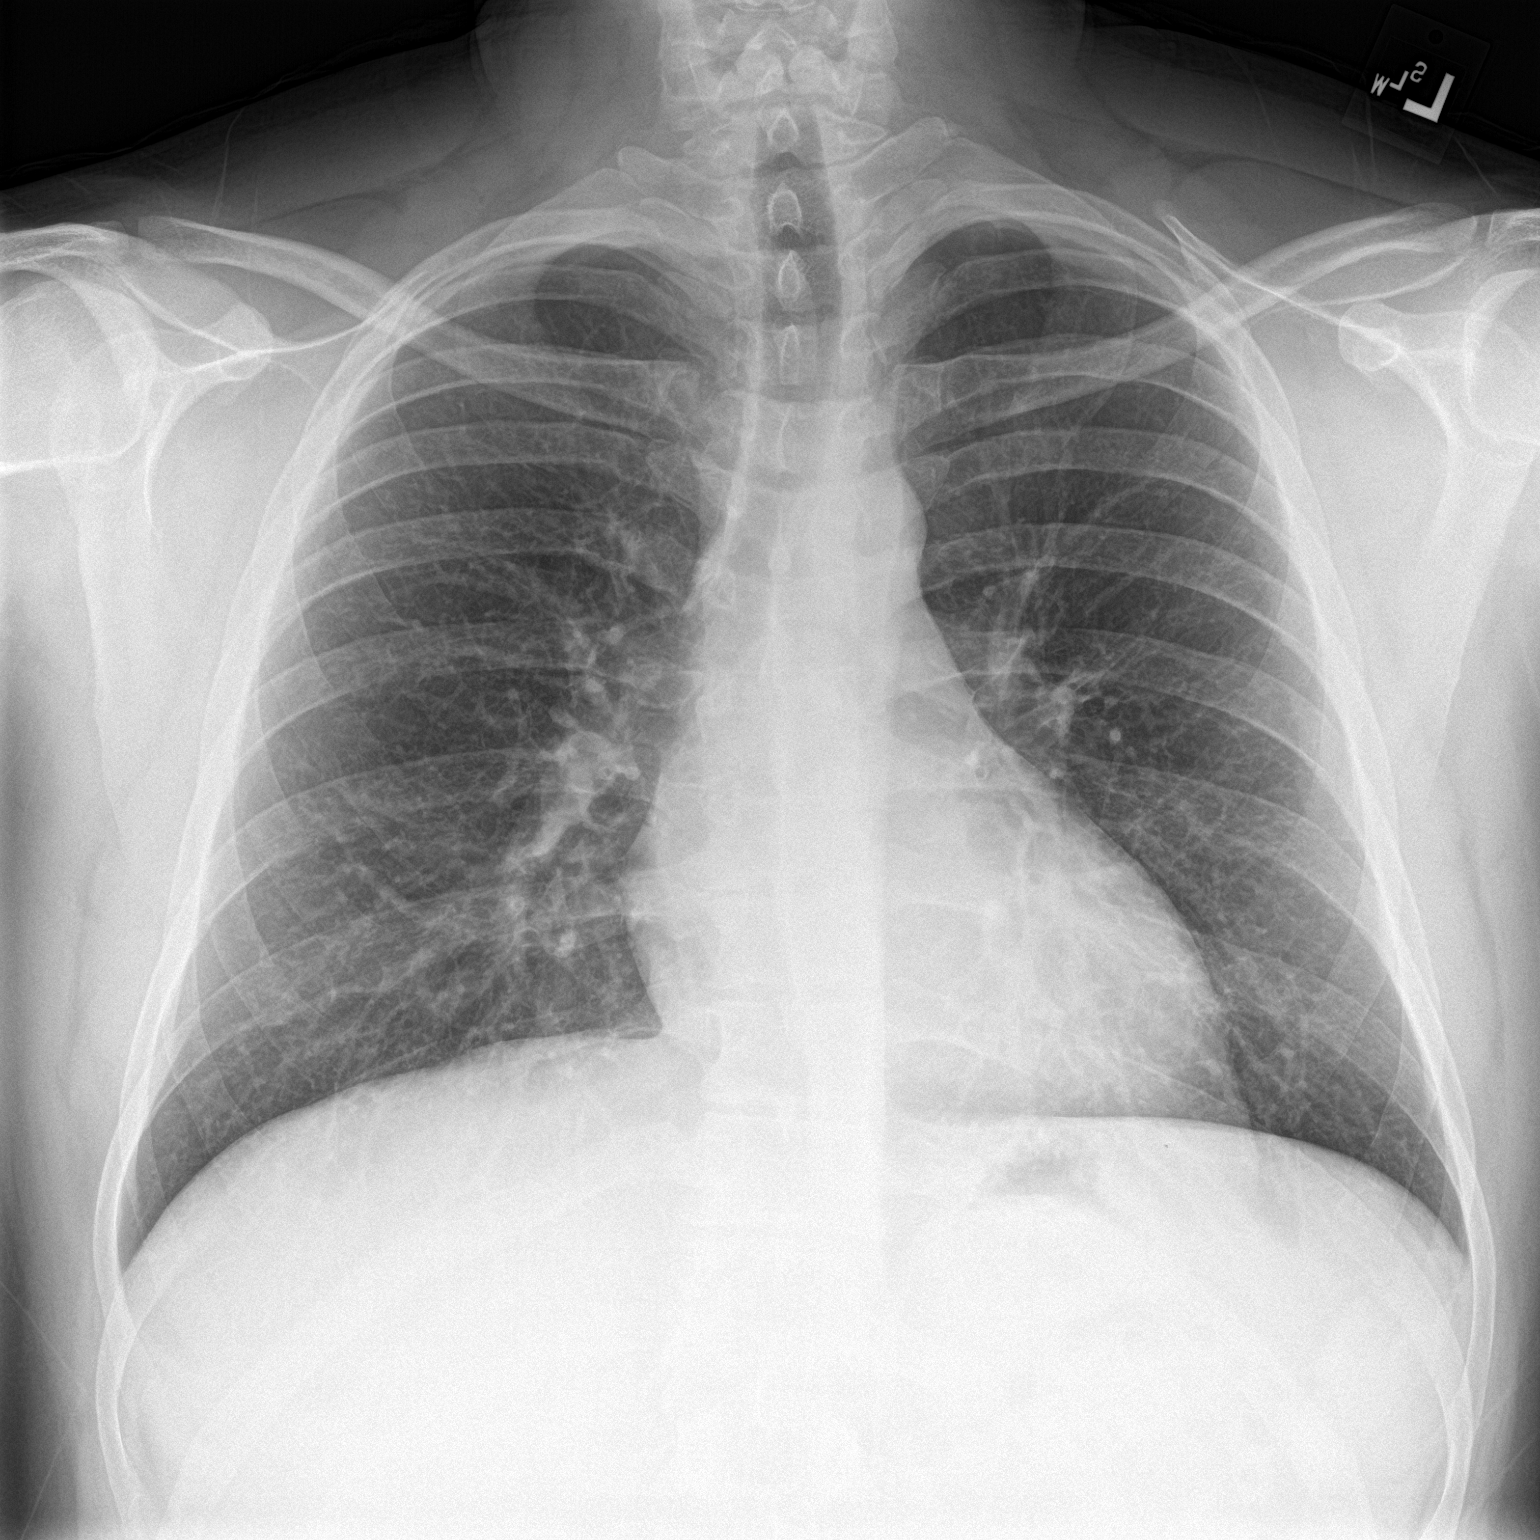

[chest lat]
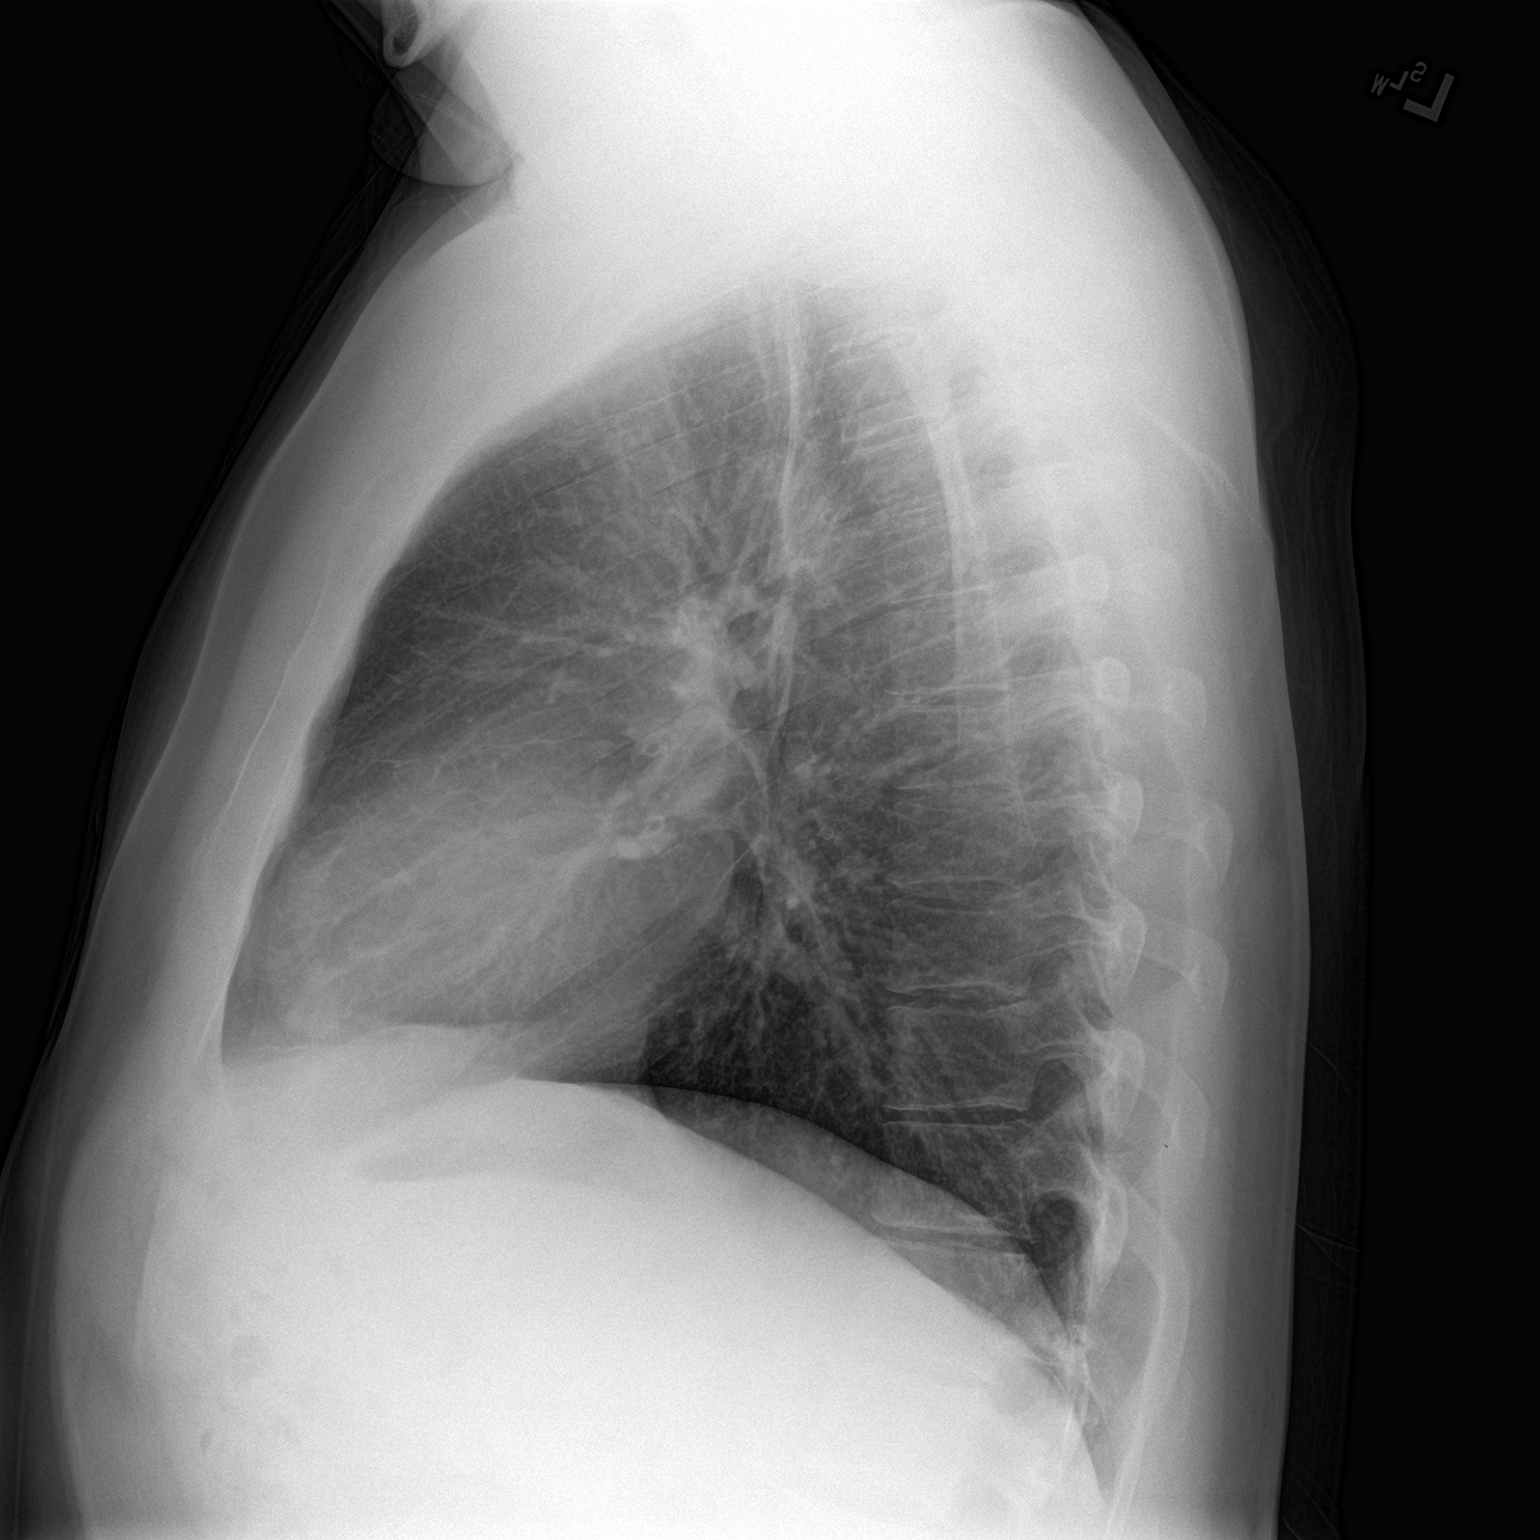

[2 of 2 positions shown; findings below may reference images not displayed]

FINDINGS: The heart size and mediastinal contours are within normal limits.
Both lungs are clear. The visualized skeletal structures are
unremarkable.
IMPRESSION: No active cardiopulmonary disease.

## 2017-08-27 ENCOUNTER — Ambulatory Visit (INDEPENDENT_AMBULATORY_CARE_PROVIDER_SITE_OTHER): Payer: BLUE CROSS/BLUE SHIELD | Admitting: Adult Health

## 2017-08-27 ENCOUNTER — Encounter: Payer: Self-pay | Admitting: Adult Health

## 2017-08-27 VITALS — BP 126/73 | HR 80 | Ht 72.5 in | Wt 248.7 lb

## 2017-08-27 DIAGNOSIS — L509 Urticaria, unspecified: Secondary | ICD-10-CM | POA: Diagnosis not present

## 2017-08-27 DIAGNOSIS — R21 Rash and other nonspecific skin eruption: Secondary | ICD-10-CM

## 2017-08-27 DIAGNOSIS — Z0000 Encounter for general adult medical examination without abnormal findings: Secondary | ICD-10-CM | POA: Diagnosis not present

## 2017-08-27 DIAGNOSIS — L500 Allergic urticaria: Secondary | ICD-10-CM | POA: Insufficient documentation

## 2017-08-27 MED ORDER — EPINEPHRINE 0.3 MG/0.3ML IJ SOAJ: 0.3000 mg | Freq: Once | INTRAMUSCULAR | 2 refills | Status: AC

## 2017-08-27 NOTE — Assessment & Plan Note (Signed)
Please continue morning Loratadine (Claritan) 10mg  and evening Ranitidine (Zantac) 150mg  nightly. Continue to push fluids. Avoid nuts, shellfish, latex, and dairy. Change shower soap from Axe to [employer]. Epi-pen sent, instructed to read directions and when to use it. Allergist referral placed.

## 2017-08-27 NOTE — Assessment & Plan Note (Addendum)
Follow heart healthy diet and please schedule full physical with fasting labs in the next few months.

## 2017-08-27 NOTE — Progress Notes (Signed)
Subjective:    Patient ID: Joshua Savage, male    DOB: [birth year], 36 y.o.   MRN: [id number]  HPI:  [name] is here to establish as a new pt.  He is a very pleasant  36 year old male.  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  He denies difficulty with ADLs or employment r/t hearing loss.  He estimates to drink gallon water/day and consumes a diet high is diary/saturated fat.  He reports can of smokeless tobacco/day and has been using on/off for >20 years.  He is married with 3 children.  He has one current complaint- 2.5 weeks ago he began breaking out in hives on posterior back/anterior chest, bil arms, and bil thighs every morning and reports it lasting for a few hours.  He was seen in UC approx 3 days after dermatological issue began and he was instructed to take OTC Loratadine each am and OTC Ranitidine each evening.  He reports despite taking the medication, the hives has continued.  He reports developing seasonal allergies >8 years ago.  He denies CP/dyspnea/palpitations/wheezineg/tingling of mouth/tongue. He reports eating ice cream every evening and that when he has cereal with milk in the morning that the "hives will stay around longer". He denies N/V/D/abdominal pain when hives develop.  He denies blood in urine/stool or change in bowel habits.He reports mild pruritis, however denies pain when hive present. Patient Care Team    Relationship Specialty Notifications Start End  Julaine Fusianford, Ky Moskowitz D, NP PCP - General Family Medicine  08/27/17     Patient Active Problem List   Diagnosis Date Noted  . Rash in adult 08/27/2017  . Healthcare maintenance 08/27/2017  . Hives of unknown origin 08/27/2017  . BLEPHARITIS, RIGHT 01/03/2010  . ACUTE MAXILLARY SINUSITIS 04/06/2009  . ALLERGIC RHINITIS 04/06/2009     History reviewed. No pertinent past medical history.   Past Surgical History:  Procedure Laterality Date  . VASECTOMY       Family History    Problem Relation Age of Onset  . Cancer Other        colon CA  . Colon polyps Father   . Diabetes Mother   . Cancer Paternal Grandmother        breast  . Hypertension Paternal Grandmother   . Stroke Paternal Grandmother   . Cancer Paternal Grandfather        colon     History  Drug Use No     History  Alcohol Use  . Yes     History  Smoking Status  . Current Some Day Smoker  . Packs/day: 1.00  . Types: Cigarettes  Smokeless Tobacco  . Current User  . Types: Chew     Outpatient Encounter Prescriptions as of 08/27/2017  Medication Sig  . loratadine (CLARITIN) 10 MG tablet Take 10 mg by mouth daily.  . ranitidine (ZANTAC) 150 MG tablet Take 150 mg by mouth at bedtime.  Marland Kitchen. EPINEPHrine 0.3 mg/0.3 mL IJ SOAJ injection Inject 0.3 mLs (0.3 mg total) into the muscle once.  . [DISCONTINUED] azithromycin (ZITHROMAX) 250 MG tablet Take 2 tabs PO x 1 dose, then 1 tab PO QD x 4 days  . [DISCONTINUED] azithromycin (ZITHROMAX) 250 MG tablet Take 1 tablet (250 mg total) by mouth daily. Take first 2 tablets together, then 1 every day until finished.  . [DISCONTINUED] benzonatate (TESSALON) 100 MG capsule Take 1 capsule (100 mg total) by mouth every  8 (eight) hours.  . [DISCONTINUED] Cetirizine HCl 10 MG CAPS Take 1 capsule (10 mg total) by mouth daily.  . [DISCONTINUED] dextromethorphan-guaiFENesin (MUCINEX DM) 30-600 MG 12hr tablet Take 1 tablet by mouth 2 (two) times daily.  . [DISCONTINUED] fluticasone (FLONASE) 50 MCG/ACT nasal spray Place 2 sprays into the nose daily.   No facility-administered encounter medications on file as of 08/27/2017.     Allergies: Patient has no known allergies.  Body mass index is 33.27 kg/m.  Blood pressure 126/73, pulse 80, height 6' 0.5" (1.842 m), weight 248 lb 11.2 oz (112.8 kg).   Review of Systems  Constitutional: Positive for fatigue. Negative for activity change, appetite change, chills, diaphoresis, fever and unexpected weight  change.  HENT: Negative for congestion.   Eyes: Negative for visual disturbance.  Respiratory: Negative for cough, chest tightness, shortness of breath, wheezing and stridor.   Cardiovascular: Negative for chest pain, palpitations and leg swelling.  Gastrointestinal: Negative for abdominal distention, abdominal pain, blood in stool, constipation, diarrhea, nausea and vomiting.  Endocrine: Negative for cold intolerance, heat intolerance, polydipsia, polyphagia and polyuria.  Genitourinary: Negative for dysuria, flank pain and hematuria.  Musculoskeletal: Negative for arthralgias, back pain, gait problem, joint swelling, myalgias, neck pain and neck stiffness.  Skin: Positive for rash. Negative for color change, pallor and wound.  Allergic/Immunologic: Negative for immunocompromised state.  Neurological: Negative for dizziness and light-headedness.  Hematological: Does not bruise/bleed easily.  Psychiatric/Behavioral: Negative for dysphoric mood, self-injury, sleep disturbance and suicidal ideas. The patient is not nervous/anxious.        Objective:   Physical Exam  Constitutional: He is oriented to person, place, and time. He appears well-developed and well-nourished. No distress.  HENT:  Head: Normocephalic and atraumatic.  Right Ear: External ear normal. Decreased hearing is noted.  Left Ear: External ear normal. Decreased hearing is noted.  Eyes: Pupils are equal, round, and reactive to light. Conjunctivae are normal.  Cardiovascular: Normal rate, regular rhythm, normal heart sounds and intact distal pulses.   Pulmonary/Chest: Effort normal and breath sounds normal. No respiratory distress. He has no wheezes. He has no rales. He exhibits no tenderness.  Neurological: He is alert and oriented to person, place, and time. Coordination normal.  Skin: Skin is warm and dry. Rash noted. Rash is urticarial. He is not diaphoretic. No pallor.     Very faint urticarial rash on bil FAs and R  inner thigh.  He produced cell phone photographs depicting severe urticarial hives on posterior/anterior thorax and bil inner thighs.   No open tissue/drainage/streaking noted anywhere.  Psychiatric: He has a normal mood and affect. His behavior is normal. Judgment and thought content normal.  Nursing note and vitals reviewed.         Assessment & Plan:   1. Healthcare maintenance   2. Rash in adult   3. Hives of unknown origin     Healthcare maintenance Follow heart healthy diet and please schedule full physical with fasting labs in the next few months.   Hives of unknown origin Please continue morning Loratadine (Claritan) 10mg  and evening Ranitidine (Zantac) 150mg  nightly. Continue to push fluids. Avoid nuts, shellfish, latex, and dairy. Change shower soap from Axe to [place]. Epi-pen sent, instructed to read directions and when to use it. Allergist referral placed.     FOLLOW-UP:  Return in about 3 months (around 11/27/2017) for CPE, Fasting Lab Draw.

## 2017-08-27 NOTE — Patient Instructions (Addendum)
Hives Hives (urticaria) are itchy, red, swollen areas on your skin. Hives can appear on any part of your body and can vary in size. They can be as small as the tip of a pen or much larger. Hives often fade within 24 hours (acute hives). In other cases, new hives appear after old ones fade. This cycle can continue for several days or weeks (chronic hives). Hives result from your body's reaction to an irritant or to something that you are allergic to (trigger). When you are exposed to a trigger, your body releases a chemical (histamine) that causes redness, itching, and swelling. You can get hives immediately after being exposed to a trigger or hours later. Hives do not spread from person to person (are not contagious). Your hives may get worse with scratching, exercise, and emotional stress. What are the causes? Causes of this condition include:  Allergies to certain foods or ingredients.  Insect bites or stings.  Exposure to pollen or pet dander.  Contact with latex or chemicals.  Spending time in sunlight, heat, or cold (exposure).  Exercise.  Stress.  You can also get hives from some medical conditions and treatments. These include:  Viruses, including the common cold.  Bacterial infections, such as urinary tract infections and strep throat.  Disorders such as vasculitis, lupus, or thyroid disease.  Certain medications.  Allergy shots.  Blood transfusions.  Sometimes, the cause of hives is not known (idiopathic hives). What increases the risk? This condition is more likely to develop in:  Women.  People who have food allergies, especially to citrus fruits, milk, eggs, peanuts, tree nuts, or shellfish.  People who are allergic to: ? Medicines. ? Latex. ? Insects. ? Animals. ? Pollen.  People who have certain medical conditions, includinglupus or thyroid disease.  What are the signs or symptoms? The main symptom of this condition is raised, itchyred or white  bumps or patches on your skin. These areas may:  Become large and swollen (welts).  Change in shape and location, quickly and repeatedly.  Be separate hives or connect over a large area of skin.  Sting or become painful.  Turn white when pressed in the center (blanch).  In severe cases, yourhands, feet, and face may also become swollen. This may occur if hives develop deeper in your skin. How is this diagnosed? This condition is diagnosed based on your symptoms, medical history, and physical exam. Your skin, urine, or blood may be tested to find out what is causing your hives and to rule out other health issues. Your health care provider may also remove a small sample of skin from the affected area and examine it under a microscope (biopsy). How is this treated? Treatment depends on the severity of your condition. Your health care provider may recommend using cool, wet cloths (cool compresses) or taking cool showers to relieve itching. Hives are sometimes treated with medicines, including:  Antihistamines.  Corticosteroids.  Antibiotics.  An injectable medicine (omalizumab). Your health care provider may prescribe this if you have chronic idiopathic hives and you continue to have symptoms even after treatment with antihistamines.  Severe cases may require an emergency injection of adrenaline (epinephrine) to prevent a life-threatening allergic reaction (anaphylaxis). Follow these instructions at home: Medicines  Take or apply over-the-counter and prescription medicines only as told by your health care provider.  If you were prescribed an antibiotic medicine, use it as told by your health care provider. Do not stop taking the antibiotic even if you start  to feel better. Skin Care  Apply cool compresses to the affected areas.  Do not scratch or rub your skin. General instructions  Do not take hot showers or baths. This can make itching worse.  Do not wear tight-fitting  clothing.  Use sunscreen and wear protective clothing when you are outside.  Avoid any substances that cause your hives. Keep a journal to help you track what causes your hives. Write down: ? What medicines you take. ? What you eat and drink. ? What products you use on your skin.  Keep all follow-up visits as told by your health care provider. This is important. Contact a health care provider if:  Your symptoms are not controlled with medicine.  Your joints are painful or swollen. Get help right away if:  You have a fever.  You have pain in your abdomen.  Your tongue or lips are swollen.  Your eyelids are swollen.  Your chest or throat feels tight.  You have trouble breathing or swallowing. These symptoms may represent a serious problem that is an emergency. Do not wait to see if the symptoms will go away. Get medical help right away. Call your local emergency services (911 in the [place] Do not drive yourself to the hospital. This information is not intended to replace advice given to you by your health care provider. Make sure you discuss any questions you have with your health care provider. Document Released: 10/16/2005 Document Revised: 03/15/2016 Document Reviewed: 08/04/2015 Elsevier Interactive Patient Education  2017 [employer] Inc[employer]    Heart-Healthy Eating Plan Many factors influence your heart health, including eating and exercise habits. Heart (coronary) risk increases with abnormal blood fat (lipid) levels. Heart-healthy meal planning includes limiting unhealthy fats, increasing healthy fats, and making other small dietary changes. This includes maintaining a healthy body weight to help keep lipid levels within a normal range. What is my plan? Your health care provider recommends that you:  Get no more than _25___% of the total calories in your daily diet from fat.  Limit your intake of saturated fat to less than ___5__% of your total calories each day.  Limit  the amount of cholesterol in your diet to less than __300__ mg per day.  What types of fat should I choose?  Choose healthy fats more often. Choose monounsaturated and polyunsaturated fats, such as olive oil and canola oil, flaxseeds, walnuts, almonds, and seeds.  Eat more omega-3 fats. Good choices include salmon, mackerel, sardines, tuna, flaxseed oil, and ground flaxseeds. Aim to eat fish at least two times each week.  Limit saturated fats. Saturated fats are primarily found in animal products, such as meats, butter, and cream. Plant sources of saturated fats include palm oil, palm kernel oil, and coconut oil.  Avoid foods with partially hydrogenated oils in them. These contain trans fats. Examples of foods that contain trans fats are stick margarine, some tub margarines, cookies, crackers, and other baked goods. What general guidelines do I need to follow?  Check food labels carefully to identify foods with trans fats or high amounts of saturated fat.  Fill one half of your plate with vegetables and green salads. Eat 4-5 servings of vegetables per day. A serving of vegetables equals 1 cup of raw leafy vegetables,  cup of raw or cooked cut-up vegetables, or  cup of vegetable juice.  Fill one fourth of your plate with whole grains. Look for the word "whole" as the first word in the ingredient list.  Fill one fourth  of your plate with lean protein foods.  Eat 4-5 servings of fruit per day. A serving of fruit equals one medium whole fruit,  cup of dried fruit,  cup of fresh, frozen, or canned fruit, or  cup of 100% fruit juice.  Eat more foods that contain soluble fiber. Examples of foods that contain this type of fiber are apples, broccoli, carrots, beans, peas, and barley. Aim to get 20-30 g of fiber per day.  Eat more home-cooked food and less restaurant, buffet, and fast food.  Limit or avoid alcohol.  Limit foods that are high in starch and sugar.  Avoid fried foods.  Cook  foods by using methods other than frying. Baking, boiling, grilling, and broiling are all great options. Other fat-reducing suggestions include: ? Removing the skin from poultry. ? Removing all visible fats from meats. ? Skimming the fat off of stews, soups, and gravies before serving them. ? Steaming vegetables in water or broth.  Lose weight if you are overweight. Losing just 5-10% of your initial body weight can help your overall health and prevent diseases such as diabetes and heart disease.  Increase your consumption of nuts, legumes, and seeds to 4-5 servings per week. One serving of dried beans or legumes equals  cup after being cooked, one serving of nuts equals 1 ounces, and one serving of seeds equals  ounce or 1 tablespoon.  You may need to monitor your salt (sodium) intake, especially if you have high blood pressure. Talk with your health care provider or dietitian to get more information about reducing sodium. What foods can I eat? Grains  Breads, including [place], white, pita, wheat, raisin, rye, oatmeal, and [place]. Tortillas that are neither fried nor made with lard or trans fat. Low-fat rolls, including hotdog and hamburger buns and English muffins. Biscuits. Muffins. Waffles. Pancakes. Light popcorn. Whole-grain cereals. Flatbread. Melba toast. Pretzels. Breadsticks. Rusks. Low-fat snacks and crackers, including oyster, saltine, matzo, graham, animal, and rye. Rice and pasta, including brown rice and those that are made with whole wheat. Vegetables All vegetables. Fruits All fruits, but limit coconut. Meats and Other Protein Sources Lean, well-trimmed beef, veal, pork, and lamb. Chicken and [place] without skin. All fish and shellfish. Wild duck, rabbit, pheasant, and venison. Egg whites or low-cholesterol egg substitutes. Dried beans, peas, lentils, and tofu.Seeds and most nuts. Dairy Low-fat or nonfat cheeses, including ricotta, string, and mozzarella. Skim or 1% milk  that is liquid, powdered, or evaporated. Buttermilk that is made with low-fat milk. Nonfat or low-fat yogurt. Beverages Mineral water. Diet carbonated beverages. Sweets and Desserts Sherbets and fruit ices. Honey, jam, marmalade, jelly, and syrups. Meringues and gelatins. Pure sugar candy, such as hard candy, jelly beans, gumdrops, mints, marshmallows, and small amounts of dark chocolate. [employer]. Eat all sweets and desserts in moderation. Fats and Oils Nonhydrogenated (trans-free) margarines. Vegetable oils, including soybean, sesame, sunflower, olive, peanut, safflower, corn, canola, and cottonseed. Salad dressings or mayonnaise that are made with a vegetable oil. Limit added fats and oils that you use for cooking, baking, salads, and as spreads. Other Cocoa powder. Coffee and tea. All seasonings and condiments. The items listed above may not be a complete list of recommended foods or beverages. Contact your dietitian for more options. What foods are not recommended? Grains Breads that are made with saturated or trans fats, oils, or whole milk. Croissants. Butter rolls. Cheese breads. Sweet rolls. Donuts. Buttered popcorn. Chow mein noodles. High-fat crackers, such as cheese or butter crackers.  Meats and Other Protein Sources Fatty meats, such as hotdogs, short ribs, sausage, spareribs, bacon, ribeye roast or steak, and mutton. High-fat deli meats, such as salami and bologna. Caviar. Domestic duck and goose. Organ meats, such as kidney, liver, sweetbreads, brains, gizzard, chitterlings, and heart. Dairy Cream, sour cream, cream cheese, and creamed cottage cheese. Whole milk cheeses, including blue (bleu), [address], [place], [place], [address], [place], [address], [place], [place], and [place]. Whole or 2% milk that is liquid, evaporated, or condensed. Whole buttermilk. Cream sauce or high-fat cheese sauce. Yogurt that is made from whole milk. Beverages Regular sodas and drinks with added  sugar. Sweets and Desserts Frosting. Pudding. Cookies. Cakes other than angel food cake. Candy that has milk chocolate or white chocolate, hydrogenated fat, butter, coconut, or unknown ingredients. Buttered syrups. Full-fat ice cream or ice cream drinks. Fats and Oils Gravy that has suet, meat fat, or shortening. Cocoa butter, hydrogenated oils, palm oil, coconut oil, palm kernel oil. These can often be found in baked products, candy, fried foods, nondairy creamers, and whipped toppings. Solid fats and shortenings, including bacon fat, salt pork, lard, and butter. Nondairy cream substitutes, such as coffee creamers and sour cream substitutes. Salad dressings that are made of unknown oils, cheese, or sour cream. The items listed above may not be a complete list of foods and beverages to avoid. Contact your dietitian for more information. This information is not intended to replace advice given to you by your health care provider. Make sure you discuss any questions you have with your health care provider. Document Released: 07/25/2008 Document Revised: 05/05/2016 Document Reviewed: 04/09/2014 Elsevier Interactive Patient Education  2017 Elsevier Inc.  Please continue morning Loratadine (Claritan) 10mg  and evening Ranitidine (Zantac) 150mg  nightly. Continue to push fluids. Avoid nuts, shellfish, latex, and dairy. Change shower soap from [place] to [place]. Epi-pen sent, instructed to read directions and when to use it. Allergist referral placed. Follow heart healthy diet and please schedule full physical with fasting labs in the next few months.  WELCOME TO THE PRACTICE!

## 2017-09-06 ENCOUNTER — Encounter: Payer: Self-pay | Admitting: Adult Health

## 2017-09-06 ENCOUNTER — Ambulatory Visit (INDEPENDENT_AMBULATORY_CARE_PROVIDER_SITE_OTHER): Payer: BLUE CROSS/BLUE SHIELD | Admitting: Adult Health

## 2017-09-06 VITALS — BP 133/81 | HR 82 | Ht 72.5 in | Wt 248.6 lb

## 2017-09-06 DIAGNOSIS — L509 Urticaria, unspecified: Secondary | ICD-10-CM

## 2017-09-06 MED ORDER — PREDNISONE 10 MG (21) PO TBPK: ORAL_TABLET | ORAL | 0 refills | Status: DC

## 2017-09-06 NOTE — Progress Notes (Signed)
Subjective:    Patient ID: Joshua Savage, male    DOB: 22-Dec-1980, 36 y.o.   MRN: [id number]  HPI:  08/27/2017 OV Note: [name] is here to establish as a new pt.  He is a very pleasant  36 year old male.  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  He denies difficulty with ADLs or employment r/t hearing loss.  He estimates to drink gallon water/day and consumes a diet high is diary/saturated fat.  He reports can of smokeless tobacco/day and has been using on/off for >20 years.  He is married with 3 children.  He has one current complaint- 2.5 weeks ago he began breaking out in hives on posterior back/anterior chest, bil arms, and bil thighs every morning and reports it lasting for a few hours.  He was seen in UC approx 3 days after dermatological issue began and he was instructed to take OTC Loratadine each am and OTC Ranitidine each evening.  He reports despite taking the medication, the hives has continued.  He reports developing seasonal allergies >8 years ago.  He denies CP/dyspnea/palpitations/wheezineg/tingling of mouth/tongue. He reports eating ice cream every evening and that when he has cereal with milk in the morning that the "hives will stay around longer". He denies N/V/D/abdominal pain when hives develop.  He denies blood in urine/stool or change in bowel habits.He reports mild pruritis, however denies pain when hive present.  Today's OV Notes 09/06/2017: [name] is here due to continued hives that will develop over night and will remain until mid morning.  He feels that the outbreak areas have spread to include bil lower extremities to mid shin and bil FAs. He has continued to take daily loratadine and ranitidine.  He has stopped eating dairy.  He denies CP/dyspnea/palpitations/wheezing/swelling of mouth/tongue.  Patient Care Team    Relationship Specialty Notifications Start End  Julaine Fusianford, Nadia Viar D, NP PCP - General Family Medicine  08/27/17      Patient Active Problem List   Diagnosis Date Noted  . Rash in adult 08/27/2017  . Healthcare maintenance 08/27/2017  . Hives of unknown origin 08/27/2017  . BLEPHARITIS, RIGHT 01/03/2010  . ACUTE MAXILLARY SINUSITIS 04/06/2009  . ALLERGIC RHINITIS 04/06/2009     History reviewed. No pertinent past medical history.   Past Surgical History:  Procedure Laterality Date  . VASECTOMY       Family History  Problem Relation Age of Onset  . Cancer Other        colon CA  . Colon polyps Father   . Diabetes Mother   . Cancer Paternal Grandmother        breast  . Hypertension Paternal Grandmother   . Stroke Paternal Grandmother   . Cancer Paternal Grandfather        colon     Social History   Substance and Sexual Activity  Drug Use No     Social History   Substance and Sexual Activity  Alcohol Use Yes     Social History   Tobacco Use  Smoking Status Current Some Day Smoker  . Packs/day: 1.00  . Types: Cigarettes  Smokeless Tobacco Current User  . Types: Chew     Outpatient Encounter Medications as of 09/06/2017  Medication Sig  . EPINEPHrine 0.3 mg/0.3 mL IJ SOAJ injection Inject 0.3 mLs as needed into the skin.  . hydrocortisone cream 1 % Apply 1 application 2 (two) times daily topically.  .Marland Kitchen  loratadine (CLARITIN) 10 MG tablet Take 10 mg by mouth daily.  . ranitidine (ZANTAC) 150 MG tablet Take 150 mg by mouth at bedtime.  . predniSONE (STERAPRED UNI-PAK 21 TAB) 10 MG (21) TBPK tablet Take per pak instructions   No facility-administered encounter medications on file as of 09/06/2017.     Allergies: Patient has no known allergies.  Body mass index is 33.25 kg/m.  Blood pressure 133/81, pulse 82, height 6' 0.5" (1.842 m), weight 248 lb 9.6 oz (112.8 kg).   Review of Systems  Constitutional: Positive for fatigue. Negative for activity change, appetite change, chills, diaphoresis, fever and unexpected weight change.  HENT: Negative for congestion.    Eyes: Negative for visual disturbance.  Respiratory: Negative for cough, chest tightness, shortness of breath, wheezing and stridor.   Cardiovascular: Negative for chest pain, palpitations and leg swelling.  Gastrointestinal: Negative for abdominal distention, abdominal pain, blood in stool, constipation, diarrhea, nausea and vomiting.  Endocrine: Negative for cold intolerance, heat intolerance, polydipsia, polyphagia and polyuria.  Genitourinary: Negative for dysuria, flank pain and hematuria.  Musculoskeletal: Negative for arthralgias, back pain, gait problem, joint swelling, myalgias, neck pain and neck stiffness.  Skin: Positive for rash. Negative for color change, pallor and wound.  Allergic/Immunologic: Negative for immunocompromised state.  Neurological: Negative for dizziness and light-headedness.  Hematological: Does not bruise/bleed easily.  Psychiatric/Behavioral: Negative for dysphoric mood, self-injury, sleep disturbance and suicidal ideas. The patient is not nervous/anxious.        Objective:   Physical Exam  Constitutional: He is oriented to person, place, and time. He appears well-developed and well-nourished. No distress.  HENT:  Head: Normocephalic and atraumatic.  Right Ear: External ear normal. Decreased hearing is noted.  Left Ear: External ear normal. Decreased hearing is noted.  Eyes: Conjunctivae are normal. Pupils are equal, round, and reactive to light.  Cardiovascular: Normal rate, regular rhythm, normal heart sounds and intact distal pulses.  Pulmonary/Chest: Effort normal and breath sounds normal. No respiratory distress. He has no wheezes. He has no rales. He exhibits no tenderness.  Neurological: He is alert and oriented to person, place, and time. Coordination normal.  Skin: Skin is warm and dry. Rash noted. Rash is urticarial. He is not diaphoretic. No pallor.     Very faint urticarial rash on bil FAs and bil inner thighs.  He produced cell phone  photographs depicting severe urticarial hives on posterior/anterior thorax and bil inner thighs.   No open tissue/drainage/streaking noted anywhere.  Psychiatric: He has a normal mood and affect. His behavior is normal. Judgment and thought content normal.  Nursing note and vitals reviewed.         Assessment & Plan:   1. Hives of unknown origin     Hives of unknown origin Please continue daily loratadine (Claritan) and in the evening take OTC Benadryl. Please take Prednisone dose pak per pak instructions. Please call allergist and ask to be placed on cancellation call list. Increase water intake., strive for at least 120 oz/day. Please call with any questions/concerns.     FOLLOW-UP:  Return if symptoms worsen or fail to improve.

## 2017-09-06 NOTE — Patient Instructions (Addendum)
Hives Hives (urticaria) are itchy, red, swollen areas on your skin. Hives can appear on any part of your body and can vary in size. They can be as small as the tip of a pen or much larger. Hives often fade within 24 hours (acute hives). In other cases, new hives appear after old ones fade. This cycle can continue for several days or weeks (chronic hives). Hives result from your body's reaction to an irritant or to something that you are allergic to (trigger). When you are exposed to a trigger, your body releases a chemical (histamine) that causes redness, itching, and swelling. You can get hives immediately after being exposed to a trigger or hours later. Hives do not spread from person to person (are not contagious). Your hives may get worse with scratching, exercise, and emotional stress. What are the causes? Causes of this condition include:  Allergies to certain foods or ingredients.  Insect bites or stings.  Exposure to pollen or pet dander.  Contact with latex or chemicals.  Spending time in sunlight, heat, or cold (exposure).  Exercise.  Stress.  You can also get hives from some medical conditions and treatments. These include:  Viruses, including the common cold.  Bacterial infections, such as urinary tract infections and strep throat.  Disorders such as vasculitis, lupus, or thyroid disease.  Certain medications.  Allergy shots.  Blood transfusions.  Sometimes, the cause of hives is not known (idiopathic hives). What increases the risk? This condition is more likely to develop in:  Women.  People who have food allergies, especially to citrus fruits, milk, eggs, peanuts, tree nuts, or shellfish.  People who are allergic to: ? Medicines. ? Latex. ? Insects. ? Animals. ? Pollen.  People who have certain medical conditions, includinglupus or thyroid disease.  What are the signs or symptoms? The main symptom of this condition is raised, itchyred or white  bumps or patches on your skin. These areas may:  Become large and swollen (welts).  Change in shape and location, quickly and repeatedly.  Be separate hives or connect over a large area of skin.  Sting or become painful.  Turn white when pressed in the center (blanch).  In severe cases, yourhands, feet, and face may also become swollen. This may occur if hives develop deeper in your skin. How is this diagnosed? This condition is diagnosed based on your symptoms, medical history, and physical exam. Your skin, urine, or blood may be tested to find out what is causing your hives and to rule out other health issues. Your health care provider may also remove a small sample of skin from the affected area and examine it under a microscope (biopsy). How is this treated? Treatment depends on the severity of your condition. Your health care provider may recommend using cool, wet cloths (cool compresses) or taking cool showers to relieve itching. Hives are sometimes treated with medicines, including:  Antihistamines.  Corticosteroids.  Antibiotics.  An injectable medicine (omalizumab). Your health care provider may prescribe this if you have chronic idiopathic hives and you continue to have symptoms even after treatment with antihistamines.  Severe cases may require an emergency injection of adrenaline (epinephrine) to prevent a life-threatening allergic reaction (anaphylaxis). Follow these instructions at home: Medicines  Take or apply over-the-counter and prescription medicines only as told by your health care provider.  If you were prescribed an antibiotic medicine, use it as told by your health care provider. Do not stop taking the antibiotic even if you start  to feel better. Skin Care  Apply cool compresses to the affected areas.  Do not scratch or rub your skin. General instructions  Do not take hot showers or baths. This can make itching worse.  Do not wear tight-fitting  clothing.  Use sunscreen and wear protective clothing when you are outside.  Avoid any substances that cause your hives. Keep a journal to help you track what causes your hives. Write down: ? What medicines you take. ? What you eat and drink. ? What products you use on your skin.  Keep all follow-up visits as told by your health care provider. This is important. Contact a health care provider if:  Your symptoms are not controlled with medicine.  Your joints are painful or swollen. Get help right away if:  You have a fever.  You have pain in your abdomen.  Your tongue or lips are swollen.  Your eyelids are swollen.  Your chest or throat feels tight.  You have trouble breathing or swallowing. These symptoms may represent a serious problem that is an emergency. Do not wait to see if the symptoms will go away. Get medical help right away. Call your local emergency services (911 in the [place] Do not drive yourself to the hospital. This information is not intended to replace advice given to you by your health care provider. Make sure you discuss any questions you have with your health care provider. Document Released: 10/16/2005 Document Revised: 03/15/2016 Document Reviewed: 08/04/2015 Elsevier Interactive Patient Education  2017 [employer]  Please continue daily loratadine ([place]) and in the evening take OTC Benadryl. Please take Prednisone dose pak per pak instructions. Please call allergist and ask to be placed on cancellation call list. Increase water intake., strive for at least 120 oz/day. Please call with any questions/concerns.  FEEL BETTER

## 2017-09-06 NOTE — Assessment & Plan Note (Signed)
Please continue daily loratadine (Claritan) and in the evening take OTC Benadryl. Please take Prednisone dose pak per pak instructions. Please call allergist and ask to be placed on cancellation call list. Increase water intake., strive for at least 120 oz/day. Please call with any questions/concerns.

## 2017-09-07 ENCOUNTER — Encounter: Payer: Self-pay | Admitting: Adult Health

## 2017-09-10 ENCOUNTER — Ambulatory Visit: Payer: BLUE CROSS/BLUE SHIELD | Admitting: Adult Health

## 2017-10-02 ENCOUNTER — Encounter: Payer: Self-pay | Admitting: Allergy and Immunology

## 2017-10-02 ENCOUNTER — Ambulatory Visit: Payer: BLUE CROSS/BLUE SHIELD | Admitting: Allergy and Immunology

## 2017-10-02 ENCOUNTER — Telehealth: Payer: Self-pay | Admitting: Allergy and Immunology

## 2017-10-02 VITALS — BP 130/82 | HR 86 | Ht 74.0 in | Wt 247.8 lb

## 2017-10-02 DIAGNOSIS — R5383 Other fatigue: Secondary | ICD-10-CM

## 2017-10-02 DIAGNOSIS — T783XXD Angioneurotic edema, subsequent encounter: Secondary | ICD-10-CM | POA: Diagnosis not present

## 2017-10-02 DIAGNOSIS — J3089 Other allergic rhinitis: Secondary | ICD-10-CM

## 2017-10-02 DIAGNOSIS — K2970 Gastritis, unspecified, without bleeding: Secondary | ICD-10-CM

## 2017-10-02 DIAGNOSIS — Z91018 Allergy to other foods: Secondary | ICD-10-CM | POA: Insufficient documentation

## 2017-10-02 DIAGNOSIS — L500 Allergic urticaria: Secondary | ICD-10-CM

## 2017-10-02 DIAGNOSIS — T783XXA Angioneurotic edema, initial encounter: Secondary | ICD-10-CM | POA: Insufficient documentation

## 2017-10-02 MED ORDER — MONTELUKAST SODIUM 10 MG PO TABS: 10.0000 mg | ORAL_TABLET | Freq: Every day | ORAL | 5 refills | Status: DC

## 2017-10-02 MED ORDER — LEVOCETIRIZINE DIHYDROCHLORIDE 5 MG PO TABS: 5.0000 mg | ORAL_TABLET | Freq: Every evening | ORAL | 5 refills | Status: DC

## 2017-10-02 MED ORDER — FLUTICASONE PROPIONATE 50 MCG/ACT NA SUSP: 2.0000 | Freq: Every day | NASAL | 5 refills | Status: DC

## 2017-10-02 NOTE — Assessment & Plan Note (Addendum)
Unclear etiology. Skin tests to select food allergens were negative today. NSAIDs and emotional stress commonly exacerbate urticaria but are not the underlying etiology in this case. Physical urticarias are negative by history (i.e. pressure-induced, temperature, vibration, solar, etc.).  There are no concomitant symptoms concerning for anaphylaxis. We will rule out other potential etiologies with labs. For symptom relief, patient is to take oral antihistamines as directed.  The following labs have been ordered: FCeRI antibody, TSH, anti-thyroglobulin antibody, thyroid peroxidase antibody, tryptase, urea breath test, CBC, CMP, ESR, ANA, and galactose-alpha-1,3-galactose IgE level.  The patient will be called with further recommendations after lab results have returned.  Instructions have been discussed and provided for H1/H2 receptor blockade with titration to find lowest effective dose.  A prescription has been provided for levocetirizine, 5 mg daily as needed.  A prescription has been provided for montelukast 10 mg daily at bedtime.  Should there be a significant increase or change in symptoms, a journal is to be kept recording any foods eaten, beverages consumed, medications taken within a 6 hour period prior to the onset of symptoms, as well as record activities being performed, and environmental conditions. For any symptoms concerning for anaphylaxis, 911 is to be called immediately.

## 2017-10-02 NOTE — Assessment & Plan Note (Addendum)
   Aeroallergen avoidance measures have been discussed and provided in written form.  Levocetirizine and montelukast have been prescribed (as above).  A prescription has been provided for fluticasone nasal spray, one spray per nostril 1-2 times daily as needed. Proper nasal spray technique has been discussed and demonstrated.  Nasal saline spray (i.e., Simply Saline) or nasal saline lavage (i.e., NeilMed) is recommended as needed and prior to medicated nasal sprays. 

## 2017-10-02 NOTE — Assessment & Plan Note (Signed)
The patients history suggests shellfish and peanut allergy, though todays skin tests were negative despite a positive histamine control.  Food allergen skin testing has excellent negative predictive value however there is still a 5% chance that the allergy exists.  Therefore, we will investigate further with serum specific IgE levels and, if negative, open graded oral challenge.  A laboratory order form has been provided for serum specific IgE against peanut, peanut component, tree nut panel, and shellfish panel.  Until the food allergy has been definitively ruled out, the patient is to continue meticulous avoidance and have access to epinephrine autoinjector 2 pack.

## 2017-10-02 NOTE — Telephone Encounter (Signed)
When the patient checked out, he asked that when his blood test results come in, he wants you to call his wife to give her the results, not him.

## 2017-10-02 NOTE — Progress Notes (Signed)
New Patient Note  RE: Joshua Savage MRN: [id number] DOB: October 04, 1981 Date of Office Visit: 10/02/2017  Referring provider: Esaw Grandchild, NP Primary care provider: Esaw Grandchild, NP  Chief Complaint: Urticaria; Angioedema; and Allergic Reaction (peanut and shrimp)   History of present illness: Joshua Savage is a 36 y.o. male seen today in consultation requested by Mina Marble, NP.  Over the past 2 months, Joshua Savage has experienced recurrent episodes of hives. The hives have appeared at different times over his scalp, chest, back, arms, legs and buttocks.  The lesions are described as erythematous, raised, and pruritic.  Individual hives last less than 24 hours without leaving residual pigmentation or bruising. He has experienced associated angioedema of the lips on 2 or 3 occasions but denies concomitant cardiopulmonary or GI symptoms. He has not experienced unexpected weight loss, recurrent fevers or drenching night sweats. No specific medication, food, skin care product, detergent, soap, or other environmental triggers have been identified. The symptoms do not seem to correlate with NSAIDs use or emotional stress. He did not have symptoms consistent with a respiratory tract infection at the time of symptom onset. Joshua Savage has tried to control symptoms with OTC antihistamines which have offered moderate temporary relief. He has not been evaluated and treated in the emergency department for these symptoms. Skin biopsy has not been performed. Recently, he has been experiencing hives daily.  Joshua Savage reports that approximately 2 years ago he consumed shrimp and while eating developed the sensation that his throat was closing.  He took diphenhydramine and his symptoms resolved.  This happened on more than one occasion while consuming shrimp.  On another occasion, he ate peanuts and within minutes experienced the sensation that his throat was closing.  Again, he took diphenhydramine and the symptoms  resolved.  He has avoided shrimp and peanuts since that time. Joshua Savage experiences nasal congestion, rhinorrhea, sneezing, postnasal drainage, and occasional sinus pressure.  These symptoms occur most frequently in the springtime and in the fall.  He attempts to control the symptoms with cetirizine as needed.   Assessment and plan: Recurrent urticaria Unclear etiology. Skin tests to select food allergens were negative today. NSAIDs and emotional stress commonly exacerbate urticaria but are not the underlying etiology in this case. Physical urticarias are negative by history (i.e. pressure-induced, temperature, vibration, solar, etc.).  There are no concomitant symptoms concerning for anaphylaxis. We will rule out other potential etiologies with labs. For symptom relief, patient is to take oral antihistamines as directed.  The following labs have been ordered: FCeRI antibody, TSH, anti-thyroglobulin antibody, thyroid peroxidase antibody, tryptase, urea breath test, CBC, CMP, ESR, ANA, and galactose-alpha-1,3-galactose IgE level.  The patient will be called with further recommendations after lab results have returned.  Instructions have been discussed and provided for H1/H2 receptor blockade with titration to find lowest effective dose.  A prescription has been provided for levocetirizine, 5 mg daily as needed.  A prescription has been provided for montelukast 10 mg daily at bedtime.  Should there be a significant increase or change in symptoms, a journal is to be kept recording any foods eaten, beverages consumed, medications taken within a 6 hour period prior to the onset of symptoms, as well as record activities being performed, and environmental conditions. For any symptoms concerning for anaphylaxis, 911 is to be called immediately.  Angioedema Associated angioedema occurs in up to 50% of patients with chronic urticaria.  Treatment/diagnostic plan as outlined above.  Seasonal allergic  rhinitis  Aeroallergen avoidance  measures have been discussed and provided in written form.  Levocetirizine and montelukast have been prescribed (as above).  A prescription has been provided for fluticasone nasal spray, one spray per nostril 1-2 times daily as needed. Proper nasal spray technique has been discussed and demonstrated.  Nasal saline spray (i.e., Simply Saline) or nasal saline lavage (i.e., NeilMed) is recommended as needed and prior to medicated nasal sprays.  History of food allergy The patients history suggests shellfish and peanut allergy, though todays skin tests were negative despite a positive histamine control.  Food allergen skin testing has excellent negative predictive value however there is still a 5% chance that the allergy exists.  Therefore, we will investigate further with serum specific IgE levels and, if negative, open graded oral challenge.  A laboratory order form has been provided for serum specific IgE against peanut, peanut component, tree nut panel, and shellfish panel.  Until the food allergy has been definitively ruled out, the patient is to continue meticulous avoidance and have access to epinephrine autoinjector 2 pack.   Meds ordered this encounter  Medications  . montelukast (SINGULAIR) 10 MG tablet    Sig: Take 1 tablet (10 mg total) by mouth at bedtime.    Dispense:  30 tablet    Refill:  5  . fluticasone (FLONASE) 50 MCG/ACT nasal spray    Sig: Place 2 sprays into both nostrils daily.    Dispense:  1 g    Refill:  5  . levocetirizine (XYZAL) 5 MG tablet    Sig: Take 1 tablet (5 mg total) by mouth every evening.    Dispense:  30 tablet    Refill:  5    Diagnostics: Environmental skin testing: Positive to ragweed pollen and tree pollen. Food allergen skin testing: Negative despite a positive histamine control.    Physical examination: Blood pressure 130/82, pulse 86, height '6\' 2"'  (1.88 m), weight 247 lb 12.8 oz (112.4 kg), SpO2 97  %.  General: Alert, interactive, in no acute distress. HEENT: TMs pearly gray, turbinates moderately edematous without discharge, post-pharynx mildly erythematous. Neck: Supple without lymphadenopathy. Lungs: Clear to auscultation without wheezing, rhonchi or rales. CV: Normal S1, S2 without murmurs. Abdomen: Nondistended, nontender. Skin: Scattered erythematous urticarial type lesions primarily located lower extremities , nonvesicular. Extremities:  No clubbing, cyanosis or edema. Neuro:   Grossly intact.  Review of systems:  Review of systems negative except as noted in HPI / PMHx or noted below: Review of Systems  Constitutional: Negative.   HENT: Negative.   Eyes: Negative.   Respiratory: Negative.   Cardiovascular: Negative.   Gastrointestinal: Negative.   Genitourinary: Negative.   Musculoskeletal: Negative.   Skin: Negative.   Neurological: Negative.   Endo/Heme/Allergies: Negative.   Psychiatric/Behavioral: Negative.     Past medical history: Allergic rhinitis Urticaria  Past surgical history:  Past Surgical History:  Procedure Laterality Date  . VASECTOMY      Family history: Family History  Problem Relation Age of Onset  . Cancer Other        colon CA  . Colon polyps Father   . Diabetes Mother   . Cancer Paternal Grandmother        breast  . Hypertension Paternal Grandmother   . Stroke Paternal Grandmother   . Cancer Paternal Grandfather        colon    Social history: Social History   Socioeconomic History  . Marital status: Married    Spouse name: Not on file  .  Number of children: Not on file  . Years of education: Not on file  . Highest education level: Not on file  Social Needs  . Financial resource strain: Not on file  . Food insecurity - worry: Not on file  . Food insecurity - inability: Not on file  . Transportation needs - medical: Not on file  . Transportation needs - non-medical: Not on file  Occupational History  . Not on  file  Tobacco Use  . Smoking status: Current Some Day Smoker    Packs/day: 1.00    Types: Cigarettes  . Smokeless tobacco: Current User    Types: Chew  Substance and Sexual Activity  . Alcohol use: Yes  . Drug use: No  . Sexual activity: Yes    Birth control/protection: IUD, Surgical  Other Topics Concern  . Not on file  Social History Narrative  . Not on file   Environmental History: Patient lives in a house with carpeting in the bedroom and central air/heat.  There is 1 dog in the home which has access to his bedroom.  He is a non-smoker.  There is no known mold/water damage in the home.  Allergies as of 10/02/2017   No Known Allergies     Medication List        Accurate as of 10/02/17  5:37 PM. Always use your most recent med list.          EPINEPHrine 0.3 mg/0.3 mL Soaj injection Commonly known as:  EPI-PEN Inject 0.3 mLs as needed into the skin.   fluticasone 50 MCG/ACT nasal spray Commonly known as:  FLONASE Place 2 sprays into both nostrils daily.   hydrocortisone cream 1 % Apply 1 application 2 (two) times daily topically.   levocetirizine 5 MG tablet Commonly known as:  XYZAL Take 1 tablet (5 mg total) by mouth every evening.   loratadine 10 MG tablet Commonly known as:  CLARITIN Take 10 mg by mouth daily.   montelukast 10 MG tablet Commonly known as:  SINGULAIR Take 1 tablet (10 mg total) by mouth at bedtime.   predniSONE 10 MG (21) Tbpk tablet Commonly known as:  STERAPRED UNI-PAK 21 TAB Take per pak instructions   ranitidine 150 MG tablet Commonly known as:  ZANTAC Take 150 mg by mouth at bedtime.       Known medication allergies: No Known Allergies  I appreciate the opportunity to take part in Graden's care. Please do not hesitate to contact me with questions.  Sincerely,   R. Edgar Frisk, MD

## 2017-10-02 NOTE — Assessment & Plan Note (Signed)
Associated angioedema occurs in up to 50% of patients with chronic urticaria.  Treatment/diagnostic plan as outlined above. 

## 2017-10-02 NOTE — Telephone Encounter (Signed)
Advised patient it takes 5-7 business days and we will contact wife when results

## 2017-10-02 NOTE — Assessment & Plan Note (Deleted)
   A prescription has been provided for fluticasone nasal spray, one spray per nostril 1-2 times daily as needed. Proper nasal spray technique has been discussed and demonstrated.  Nasal saline spray (i.e., Simply Saline) or nasal saline lavage (i.e., NeilMed) is recommended as needed and prior to medicated nasal sprays. 

## 2017-10-02 NOTE — Patient Instructions (Addendum)
Recurrent urticaria Unclear etiology. Skin tests to select food allergens were negative today. NSAIDs and emotional stress commonly exacerbate urticaria but are not the underlying etiology in this case. Physical urticarias are negative by history (i.e. pressure-induced, temperature, vibration, solar, etc.).  There are no concomitant symptoms concerning for anaphylaxis. We will rule out other potential etiologies with labs. For symptom relief, patient is to take oral antihistamines as directed.  The following labs have been ordered: FCeRI antibody, TSH, anti-thyroglobulin antibody, thyroid peroxidase antibody, tryptase, urea breath test, CBC, CMP, ESR, ANA, and galactose-alpha-1,3-galactose IgE level.  The patient will be called with further recommendations after lab results have returned.  Instructions have been discussed and provided for H1/H2 receptor blockade with titration to find lowest effective dose.  A prescription has been provided for levocetirizine, 5 mg daily as needed.  A prescription has been provided for montelukast 10 mg daily at bedtime.  Should there be a significant increase or change in symptoms, a journal is to be kept recording any foods eaten, beverages consumed, medications taken within a 6 hour period prior to the onset of symptoms, as well as record activities being performed, and environmental conditions. For any symptoms concerning for anaphylaxis, 911 is to be called immediately.  Angioedema Associated angioedema occurs in up to 50% of patients with chronic urticaria.  Treatment/diagnostic plan as outlined above.  Seasonal allergic rhinitis  Aeroallergen avoidance measures have been discussed and provided in written form.  Levocetirizine and montelukast have been prescribed (as above).  A prescription has been provided for fluticasone nasal spray, one spray per nostril 1-2 times daily as needed. Proper nasal spray technique has been discussed and  demonstrated.  Nasal saline spray (i.e., Simply Saline) or nasal saline lavage (i.e., NeilMed) is recommended as needed and prior to medicated nasal sprays.  History of food allergy The patients history suggests shellfish and peanut allergy, though todays skin tests were negative despite a positive histamine control.  Food allergen skin testing has excellent negative predictive value however there is still a 5% chance that the allergy exists.  Therefore, we will investigate further with serum specific IgE levels and, if negative, open graded oral challenge.  A laboratory order form has been provided for serum specific IgE against peanut, peanut component, tree nut panel, and shellfish panel.  Until the food allergy has been definitively ruled out, the patient is to continue meticulous avoidance and have access to epinephrine autoinjector 2 pack.   When lab results have returned the patient will be called with further recommendations and follow up instructions.   Urticaria (Hives)  . Levocetirizine (Xyzal) 5 mg twice a day and ranitidine (Zantac) 150 mg twice a day. If no symptoms for 7-14 days then decrease to. . Levocetirizine (Xyzal) 5 mg twice a day and ranitidine (Zantac) 150 mg once a day.  If no symptoms for 7-14 days then decrease to. . Levocetirizine (Xyzal) 5 mg twice a day.  If no symptoms for 7-14 days then decrease to. . Levocetirizine (Xyzal) 5 mg once a day.  May use Benadryl (diphenhydramine) as needed for breakthrough symptoms       If symptoms return, then step up dosage  Reducing Pollen Exposure  The American Academy of Allergy, Asthma and Immunology suggests the following steps to reduce your exposure to pollen during allergy seasons.    1. Do not hang sheets or clothing out to dry; pollen may collect on these items. 2. Do not mow lawns or spend time around freshly cut  grass; mowing stirs up pollen. 3. Keep windows closed at night.  Keep car windows closed while  driving. 4. Minimize morning activities outdoors, a time when pollen counts are usually at their highest. 5. Stay indoors as much as possible when pollen counts or humidity is high and on windy days when pollen tends to remain in the air longer. 6. Use air conditioning when possible.  Many air conditioners have filters that trap the pollen spores. 7. Use a HEPA room air filter to remove pollen form the indoor air you breathe.

## 2017-10-15 ENCOUNTER — Telehealth: Payer: Self-pay | Admitting: Allergy and Immunology

## 2017-10-15 NOTE — Telephone Encounter (Signed)
Still awaiting some results. Informed pt's wife.

## 2017-10-15 NOTE — Telephone Encounter (Signed)
Pt wife called to see if the result of labs have come in.[phone].

## 2017-10-24 LAB — SEDIMENTATION RATE: Sed Rate: 3 mm/hr (ref 0–15)

## 2017-10-24 LAB — CBC WITH DIFFERENTIAL/PLATELET
Basophils Absolute: 0 10*3/uL (ref 0.0–0.2)
Basos: 0 %
EOS (ABSOLUTE): 0.1 10*3/uL (ref 0.0–0.4)
Eos: 1 %
Hematocrit: 39.7 % (ref 37.5–51.0)
Hemoglobin: 13.6 g/dL (ref 13.0–17.7)
Immature Grans (Abs): 0 10*3/uL (ref 0.0–0.1)
Immature Granulocytes: 0 %
Lymphocytes Absolute: 2.3 10*3/uL (ref 0.7–3.1)
Lymphs: 33 %
MCH: 31.9 pg (ref 26.6–33.0)
MCHC: 34.3 g/dL (ref 31.5–35.7)
MCV: 93 fL (ref 79–97)
Monocytes Absolute: 0.5 10*3/uL (ref 0.1–0.9)
Monocytes: 8 %
Neutrophils Absolute: 4 10*3/uL (ref 1.4–7.0)
Neutrophils: 58 %
Platelets: 256 10*3/uL (ref 150–379)
RBC: 4.26 x10E6/uL (ref 4.14–5.80)
RDW: 12.7 % (ref 12.3–15.4)
WBC: 6.9 10*3/uL (ref 3.4–10.8)

## 2017-10-24 LAB — ALLERGEN, PEANUT COMPONENT PANEL
F352-IgE Ara h 8: <0.1 kU/L
F422-IgE Ara h 1: <0.1 kU/L
F423-IgE Ara h 2: <0.1 kU/L
F424-IgE Ara h 3: <0.1 kU/L
F427-IgE Ara h 9: <0.1 kU/L

## 2017-10-24 LAB — ALLERGENS(7)
Brazil Nut IgE: <0.1 kU/L
F020-IgE Almond: <0.1 kU/L
F202-IgE Cashew Nut: <0.1 kU/L
Hazelnut (Filbert) IgE: <0.1 kU/L
Peanut IgE: <0.1 kU/L
Pecan Nut IgE: <0.1 kU/L
Walnut IgE: <0.1 kU/L

## 2017-10-24 LAB — COMPREHENSIVE METABOLIC PANEL
ALT: 23 IU/L (ref 0–44)
AST: 19 IU/L (ref 0–40)
Albumin/Globulin Ratio: 2.7 — ABNORMAL HIGH (ref 1.2–2.2)
Albumin: 4.6 g/dL (ref 3.5–5.5)
Alkaline Phosphatase: 78 IU/L (ref 39–117)
BUN/Creatinine Ratio: 13 (ref 9–20)
BUN: 11 mg/dL (ref 6–20)
Bilirubin Total: 0.3 mg/dL (ref 0.0–1.2)
CO2: 24 mmol/L (ref 20–29)
Calcium: 9.4 mg/dL (ref 8.7–10.2)
Chloride: 104 mmol/L (ref 96–106)
Creatinine, Ser: 0.83 mg/dL (ref 0.76–1.27)
GFR calc Af Amer: 131 mL/min/{1.73_m2} (ref >59)
GFR calc non Af Amer: 113 mL/min/{1.73_m2} (ref >59)
Globulin, Total: 1.7 g/dL (ref 1.5–4.5)
Glucose: 94 mg/dL (ref 65–99)
Potassium: 4.2 mmol/L (ref 3.5–5.2)
Sodium: 143 mmol/L (ref 134–144)
Total Protein: 6.3 g/dL (ref 6.0–8.5)

## 2017-10-24 LAB — THYROID PEROXIDASE ANTIBODY: Thyroperoxidase Ab SerPl-aCnc: 10 IU/mL (ref 0–34)

## 2017-10-24 LAB — ALLERGEN PROFILE, SHELLFISH
Clam IgE: <0.1 kU/L
F023-IgE Crab: <0.1 kU/L
F080-IgE Lobster: <0.1 kU/L
F290-IgE Oyster: <0.1 kU/L
Scallop IgE: <0.1 kU/L
Shrimp IgE: <0.1 kU/L

## 2017-10-24 LAB — ALPHA-GAL PANEL
Alpha Gal IgE*: <0.1 kU/L (ref <0.10)
Beef (Bos spp) IgE: <0.1 kU/L (ref <0.35)
Class Interpretation: 0
Class Interpretation: 0
Class Interpretation: 0
Lamb/Mutton (Ovis spp) IgE: <0.1 kU/L (ref <0.35)
Pork (Sus spp) IgE: <0.1 kU/L (ref <0.35)

## 2017-10-24 LAB — TSH: TSH: 3.21 u[IU]/mL (ref 0.450–4.500)

## 2017-10-24 LAB — H. PYLORI BREATH TEST: H pylori Breath Test: NEGATIVE

## 2017-10-24 LAB — CHRONIC URTICARIA: cu index: 17.3 — ABNORMAL HIGH (ref <10)

## 2017-10-24 LAB — ANA W/REFLEX IF POSITIVE: Anti Nuclear Antibody(ANA): NEGATIVE

## 2017-10-24 LAB — THYROGLOBULIN LEVEL: Thyroglobulin (TG-RIA): 9.5 ng/mL

## 2017-10-25 ENCOUNTER — Telehealth: Payer: Self-pay

## 2017-10-25 NOTE — Telephone Encounter (Signed)
Pt's wife is calling about lab results. Please advise on what to advise to the pt?

## 2017-10-25 NOTE — Telephone Encounter (Signed)
Please inform the patient that lab results indicate autoimmune urticaria.  There is no cure for this but it has a waxing and waning course. Symptoms are suppressed with nonsedating antihistamines.  Continue H1/H2 receptor blockade, titrating to the lowest effective dose necessary to suppress urticaria. Please forward result to the patient's PCP. Thanks. 

## 2017-10-25 NOTE — Progress Notes (Signed)
Tried calling pt, he dosent have a Social workermailbox set up

## 2017-10-25 NOTE — Telephone Encounter (Signed)
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

## 2017-11-21 NOTE — Progress Notes (Signed)
Subjective:    Patient ID: Joshua Savage, male    DOB: [birth year], 37 y.o.   MRN: [id number]  HPI:  08/27/17 OV: [name] is here to establish as a new pt.  He is a very pleasant  37 year old male.  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  He denies difficulty with ADLs or employment r/t hearing loss.  He estimates to drink gallon water/day and consumes a diet high is diary/saturated fat.  He reports can of smokeless tobacco/day and has been using on/off for >20 years.  He is married with 3 children.  He has one current complaint- 2.5 weeks ago he began breaking out in hives on posterior back/anterior chest, bil arms, and bil thighs every morning and reports it lasting for a few hours.  He was seen in UC approx 3 days after dermatological issue began and he was instructed to take OTC Loratadine each am and OTC Ranitidine each evening.  He reports despite taking the medication, the hives has continued.  He reports developing seasonal allergies >8 years ago.  He denies CP/dyspnea/palpitations/wheezineg/tingling of mouth/tongue. He reports eating ice cream every evening and that when he has cereal with milk in the morning that the "hives will stay around longer". He denies N/V/D/abdominal pain when hives develop.  He denies blood in urine/stool or change in bowel habits.He reports mild pruritis, however denies pain when hive present.  11/22/17 OV: [name] is here for CPE. He was [facility] 2018, dx'd with recurrent uticaria, allergic rhinitis, environmental allergies, and food allergy (shellfish and peanuts).  He was provided info on how to reduce pollen exposure and started on antihistamines- he reports sig reduction in sx's and will f/u with them when he needs refills on rx. He denies CP/dyspnea/palpitations/N/V/D. He continues to use smokeless tobacco, cigarettes and vapes daily- he declined smoking cessation. Fasting labs obtained  today. Healthcare Maintenance:  Patient Care Team    Relationship Specialty Notifications Start End  [place]cordiaDanford, Jinny BlossomKaty D, NP PCP - General Family Medicine  08/27/17     Patient Active Problem List   Diagnosis Date Noted  . Screening for colon cancer 11/26/2017  . Angioedema 10/02/2017  . History of food allergy 10/02/2017  . Rash in adult 08/27/2017  . Healthcare maintenance 08/27/2017  . Recurrent urticaria 08/27/2017  . BLEPHARITIS, RIGHT 01/03/2010  . ACUTE MAXILLARY SINUSITIS 04/06/2009  . Seasonal allergic rhinitis 04/06/2009     History reviewed. No pertinent past medical history.   Past Surgical History:  Procedure Laterality Date  . VASECTOMY       Family History  Problem Relation Age of Onset  . Cancer Other        colon CA  . Colon polyps Father   . Diabetes Mother   . Cancer Paternal Grandmother        breast  . Hypertension Paternal Grandmother   . Stroke Paternal Grandmother   . Cancer Paternal Grandfather        colon     Social History   Substance and Sexual Activity  Drug Use No     Social History   Substance and Sexual Activity  Alcohol Use Yes     Social History   Tobacco Use  Smoking Status Current Some Day Smoker  . Packs/day: 1.00  . Types: Cigarettes  Smokeless Tobacco Current User  . Types: Chew     Outpatient Encounter Medications as  of 11/26/2017  Medication Sig  . EPINEPHrine 0.3 mg/0.3 mL IJ SOAJ injection Inject 0.3 mLs as needed into the skin.  . fluticasone (FLONASE) 50 MCG/ACT nasal spray Place 2 sprays into both nostrils daily.  Marland Kitchen levocetirizine (XYZAL) 5 MG tablet Take 1 tablet (5 mg total) by mouth every evening.  . montelukast (SINGULAIR) 10 MG tablet Take 1 tablet (10 mg total) by mouth at bedtime.  Marland Kitchen loratadine (CLARITIN) 10 MG tablet Take 10 mg by mouth daily.  . [DISCONTINUED] hydrocortisone cream 1 % Apply 1 application 2 (two) times daily topically.  . [DISCONTINUED] predniSONE (STERAPRED UNI-PAK 21  TAB) 10 MG (21) TBPK tablet Take per pak instructions (Patient not taking: Reported on 10/02/2017)  . [DISCONTINUED] ranitidine (ZANTAC) 150 MG tablet Take 150 mg by mouth at bedtime.   No facility-administered encounter medications on file as of 11/26/2017.     Allergies: Patient has no known allergies.  Body mass index is 32.47 kg/m.  Blood pressure 117/72, pulse 72, height 6\' 2"  (1.88 m), weight 252 lb 14.4 oz (114.7 kg), SpO2 96 %.   Review of Systems  Constitutional: Positive for fatigue. Negative for activity change, appetite change, chills, diaphoresis, fever and unexpected weight change.  HENT: Positive for hearing loss. Negative for congestion.   Eyes: Negative for visual disturbance.  Respiratory: Negative for cough, chest tightness, shortness of breath, wheezing and stridor.   Cardiovascular: Negative for chest pain, palpitations and leg swelling.  Gastrointestinal: Negative for abdominal distention, abdominal pain, blood in stool, constipation, diarrhea, nausea and vomiting.  Endocrine: Negative for cold intolerance, heat intolerance, polydipsia, polyphagia and polyuria.  Genitourinary: Negative for dysuria, flank pain and hematuria.  Musculoskeletal: Negative for arthralgias, back pain, gait problem, joint swelling, myalgias, neck pain and neck stiffness.  Skin: Negative for color change, pallor, rash and wound.  Allergic/Immunologic: Negative for immunocompromised state.  Neurological: Negative for dizziness and light-headedness.  Hematological: Does not bruise/bleed easily.  Psychiatric/Behavioral: Negative for dysphoric mood, self-injury, sleep disturbance and suicidal ideas. The patient is not nervous/anxious.        Objective:   Physical Exam  Constitutional: He is oriented to person, place, and time. He appears well-developed and well-nourished. No distress.  HENT:  Head: Normocephalic and atraumatic.  Right Ear: External ear normal. Tympanic membrane is not  erythematous and not bulging. Decreased hearing is noted.  Left Ear: External ear normal. Tympanic membrane is not erythematous and not bulging. Decreased hearing is noted.  Nose: Nose normal. Right sinus exhibits no maxillary sinus tenderness and no frontal sinus tenderness. Left sinus exhibits no maxillary sinus tenderness and no frontal sinus tenderness.  Mouth/Throat: Uvula is midline, oropharynx is clear and moist and mucous membranes are normal.  Eyes: Conjunctivae are normal. Pupils are equal, round, and reactive to light.  Neck: Normal range of motion. Neck supple.  Cardiovascular: Normal rate, regular rhythm, normal heart sounds and intact distal pulses.  Pulmonary/Chest: Effort normal and breath sounds normal. No respiratory distress. He has no wheezes. He has no rales. He exhibits no tenderness.  Abdominal: Soft. Bowel sounds are normal. He exhibits no distension and no mass. There is no tenderness. There is no rebound and no guarding.  Genitourinary: Rectum normal. Rectal exam shows guaiac negative stool.  Genitourinary Comments: Chaperone present during examination.  Lymphadenopathy:    He has no cervical adenopathy.  Neurological: He is alert and oriented to person, place, and time. Coordination normal.  Skin: Skin is warm and dry. No rash noted. Rash  is not urticarial. He is not diaphoretic. No pallor.  Psychiatric: He has a normal mood and affect. His behavior is normal. Judgment and thought content normal.  Nursing note and vitals reviewed.         Assessment & Plan:   1. Need for Tdap vaccination   2. Healthcare maintenance   3. Screening for colon cancer   4. Rash in adult     Healthcare maintenance Continue excellent water intake, strive for at least 125  ounces/day.   Follow Heart Healthy diet Increase regular exercise.  Recommend at least 30 minutes daily, 5 days per week of walking, jogging, biking, swimming, YouTube/Pinterest workout videos. Reduce-stop  tobacco use- YOU CAN DO IT! We will call you when your lab results are available. Follow-up with Allergist as directed. Recommend annual physicals, please contact us sooner if needed.  Rash in adult Followed by Allergist currently taking- montelukast 10mg  daily, Loratadine 10mg  daily, and Levocetirizine 5mg  daily  Screening for colon cancer Guaiac test-neg    FOLLOW-UP:  Return in about 1 year (around 11/26/2018) for CPE, Fasting Labs.   FOLLOW-UP:  Return in about 1 year (around 11/26/2018) for CPE, Fasting Labs.

## 2017-11-26 ENCOUNTER — Encounter: Payer: Self-pay | Admitting: Adult Health

## 2017-11-26 ENCOUNTER — Ambulatory Visit (INDEPENDENT_AMBULATORY_CARE_PROVIDER_SITE_OTHER): Payer: BLUE CROSS/BLUE SHIELD | Admitting: Adult Health

## 2017-11-26 VITALS — BP 117/72 | HR 72 | Ht 74.0 in | Wt 252.9 lb

## 2017-11-26 DIAGNOSIS — Z1211 Encounter for screening for malignant neoplasm of colon: Secondary | ICD-10-CM

## 2017-11-26 DIAGNOSIS — Z0000 Encounter for general adult medical examination without abnormal findings: Secondary | ICD-10-CM | POA: Diagnosis not present

## 2017-11-26 DIAGNOSIS — R21 Rash and other nonspecific skin eruption: Secondary | ICD-10-CM | POA: Diagnosis not present

## 2017-11-26 DIAGNOSIS — Z23 Encounter for immunization: Secondary | ICD-10-CM

## 2017-11-26 LAB — POC HEMOCCULT BLD/STL (OFFICE/1-CARD/DIAGNOSTIC): FECAL OCCULT BLD: NEGATIVE

## 2017-11-26 NOTE — Assessment & Plan Note (Signed)
Followed by Allergist currently taking- montelukast 10mg  daily, Loratadine 10mg  daily, and Levocetirizine 5mg  daily

## 2017-11-26 NOTE — Assessment & Plan Note (Signed)
Continue excellent water intake, strive for at least 125  ounces/day.   Follow Heart Healthy diet Increase regular exercise.  Recommend at least 30 minutes daily, 5 days per week of walking, jogging, biking, swimming, YouTube/Pinterest workout videos. Reduce-stop tobacco use- YOU CAN DO IT! We will call you when your lab results are available. Follow-up with Allergist as directed. Recommend annual physicals, please contact us sooner if needed.

## 2017-11-26 NOTE — Assessment & Plan Note (Signed)
Guaiac test-neg

## 2017-11-26 NOTE — Patient Instructions (Signed)
Heart-Healthy Eating Plan Many factors influence your heart health, including eating and exercise habits. Heart (coronary) risk increases with abnormal blood fat (lipid) levels. Heart-healthy meal planning includes limiting unhealthy fats, increasing healthy fats, and making other small dietary changes. This includes maintaining a healthy body weight to help keep lipid levels within a normal range. What is my plan? Your health care provider recommends that you:  Get no more than __25___% of the total calories in your daily diet from fat.  Limit your intake of saturated fat to less than __5___% of your total calories each day.  Limit the amount of cholesterol in your diet to less than __300__ mg per day.  What types of fat should I choose?  Choose healthy fats more often. Choose monounsaturated and polyunsaturated fats, such as olive oil and canola oil, flaxseeds, walnuts, almonds, and seeds.  Eat more omega-3 fats. Good choices include salmon, mackerel, sardines, tuna, flaxseed oil, and ground flaxseeds. Aim to eat fish at least two times each week.  Limit saturated fats. Saturated fats are primarily found in animal products, such as meats, butter, and cream. Plant sources of saturated fats include palm oil, palm kernel oil, and coconut oil.  Avoid foods with partially hydrogenated oils in them. These contain trans fats. Examples of foods that contain trans fats are stick margarine, some tub margarines, cookies, crackers, and other baked goods. What general guidelines do I need to follow?  Check food labels carefully to identify foods with trans fats or high amounts of saturated fat.  Fill one half of your plate with vegetables and green salads. Eat 4-5 servings of vegetables per day. A serving of vegetables equals 1 cup of raw leafy vegetables,  cup of raw or cooked cut-up vegetables, or  cup of vegetable juice.  Fill one fourth of your plate with whole grains. Look for the word "whole"  as the first word in the ingredient list.  Fill one fourth of your plate with lean protein foods.  Eat 4-5 servings of fruit per day. A serving of fruit equals one medium whole fruit,  cup of dried fruit,  cup of fresh, frozen, or canned fruit, or  cup of 100% fruit juice.  Eat more foods that contain soluble fiber. Examples of foods that contain this type of fiber are apples, broccoli, carrots, beans, peas, and barley. Aim to get 20-30 g of fiber per day.  Eat more home-cooked food and less restaurant, buffet, and fast food.  Limit or avoid alcohol.  Limit foods that are high in starch and sugar.  Avoid fried foods.  Cook foods by using methods other than frying. Baking, boiling, grilling, and broiling are all great options. Other fat-reducing suggestions include: ? Removing the skin from poultry. ? Removing all visible fats from meats. ? Skimming the fat off of stews, soups, and gravies before serving them. ? Steaming vegetables in water or broth.  Lose weight if you are overweight. Losing just 5-10% of your initial body weight can help your overall health and prevent diseases such as diabetes and heart disease.  Increase your consumption of nuts, legumes, and seeds to 4-5 servings per week. One serving of dried beans or legumes equals  cup after being cooked, one serving of nuts equals 1 ounces, and one serving of seeds equals  ounce or 1 tablespoon.  You may need to monitor your salt (sodium) intake, especially if you have high blood pressure. Talk with your health care provider or dietitian to get  more information about reducing sodium. What foods can I eat? Grains  Breads, including [place], white, pita, wheat, raisin, rye, oatmeal, and [place]. Tortillas that are neither fried nor made with lard or trans fat. Low-fat rolls, including hotdog and hamburger buns and English muffins. Biscuits. Muffins. Waffles. Pancakes. Light popcorn. Whole-grain cereals. Flatbread. Melba  toast. Pretzels. Breadsticks. Rusks. Low-fat snacks and crackers, including oyster, saltine, matzo, graham, animal, and rye. Rice and pasta, including brown rice and those that are made with whole wheat. Vegetables All vegetables. Fruits All fruits, but limit coconut. Meats and Other Protein Sources Lean, well-trimmed beef, veal, pork, and lamb. Chicken and [place] without skin. All fish and shellfish. Wild duck, rabbit, pheasant, and venison. Egg whites or low-cholesterol egg substitutes. Dried beans, peas, lentils, and tofu.Seeds and most nuts. Dairy Low-fat or nonfat cheeses, including ricotta, string, and mozzarella. Skim or 1% milk that is liquid, powdered, or evaporated. Buttermilk that is made with low-fat milk. Nonfat or low-fat yogurt. Beverages Mineral water. Diet carbonated beverages. Sweets and Desserts Sherbets and fruit ices. Honey, jam, marmalade, jelly, and syrups. Meringues and gelatins. Pure sugar candy, such as hard candy, jelly beans, gumdrops, mints, marshmallows, and small amounts of dark chocolate. [employer] Eat all sweets and desserts in moderation. Fats and Oils Nonhydrogenated (trans-free) margarines. Vegetable oils, including soybean, sesame, sunflower, olive, peanut, safflower, corn, canola, and cottonseed. Salad dressings or mayonnaise that are made with a vegetable oil. Limit added fats and oils that you use for cooking, baking, salads, and as spreads. Other Cocoa powder. Coffee and tea. All seasonings and condiments. The items listed above may not be a complete list of recommended foods or beverages. Contact your dietitian for more options. What foods are not recommended? Grains Breads that are made with saturated or trans fats, oils, or whole milk. Croissants. Butter rolls. Cheese breads. Sweet rolls. Donuts. Buttered popcorn. Chow mein noodles. High-fat crackers, such as cheese or butter crackers. Meats and Other Protein Sources Fatty meats, such as  hotdogs, short ribs, sausage, spareribs, bacon, ribeye roast or steak, and mutton. High-fat deli meats, such as salami and bologna. Caviar. Domestic duck and goose. Organ meats, such as kidney, liver, sweetbreads, brains, gizzard, chitterlings, and heart. Dairy Cream, sour cream, cream cheese, and creamed cottage cheese. Whole milk cheeses, including blue (bleu), Monterey Jack, [place], [place], American, [place], [place], [place], [place], and [place]. Whole or 2% milk that is liquid, evaporated, or condensed. Whole buttermilk. Cream sauce or high-fat cheese sauce. Yogurt that is made from whole milk. Beverages Regular sodas and drinks with added sugar. Sweets and Desserts Frosting. Pudding. Cookies. Cakes other than angel food cake. Candy that has milk chocolate or white chocolate, hydrogenated fat, butter, coconut, or unknown ingredients. Buttered syrups. Full-fat ice cream or ice cream drinks. Fats and Oils Gravy that has suet, meat fat, or shortening. Cocoa butter, hydrogenated oils, palm oil, coconut oil, palm kernel oil. These can often be found in baked products, candy, fried foods, nondairy creamers, and whipped toppings. Solid fats and shortenings, including bacon fat, salt pork, lard, and butter. Nondairy cream substitutes, such as coffee creamers and sour cream substitutes. Salad dressings that are made of unknown oils, cheese, or sour cream. The items listed above may not be a complete list of foods and beverages to avoid. Contact your dietitian for more information. This information is not intended to replace advice given to you by your health care provider. Make sure you discuss any questions you have with your health care  provider. Document Released: 07/25/2008 Document Revised: 05/05/2016 Document Reviewed: 04/09/2014 Elsevier Interactive Patient Education  2018 [employer].   Tobacco Use Disorder Tobacco use disorder (TUD) is a mental disorder. It is the long-term use of tobacco in  spite of related health problems or difficulty with normal life activities. Tobacco is most commonly smoked as cigarettes and less commonly as cigars or pipes. Smokeless chewing tobacco and snuff are also popular. People with TUD get a feeling of extreme pleasure (euphoria) from using tobacco and have a desire to use it again and again. Repeated use of tobacco can cause problems. The addictive effects of tobacco are due mainly tothe ingredient nicotine. Nicotine also causes a rush of adrenaline (epinephrine) in the body. This leads to increased blood pressure, heart rate, and breathing rate. These changes may cause problems for people with high blood pressure, weak hearts, or lung disease. High doses of nicotine in children and pets can lead to seizures and death. Tobacco contains a number of other unsafe chemicals. These chemicals are especially harmful when inhaled as smoke and can damage almost every organ in the body. Smokers live shorter lives than nonsmokers and are at risk of dying from a number of diseases and cancers. Tobacco smoke can also cause health problems for nonsmokers (due to inhaling secondhand smoke). Smoking is also a fire hazard. TUD usually starts in the late teenage years and is most common in young adults between the ages of 64 and 25 years. People who start smoking earlier in life are more likely to continue smoking as adults. TUD is somewhat more common in men than women. People with TUD are at higher risk for using alcohol and other drugs of abuse. What increases the risk? Risk factors for TUD include:  Having family members with the disorder.  Being around people who use tobacco.  Having an existing mental health issue such as schizophrenia, depression, bipolar disorder, ADHD, or posttraumatic stress disorder (PTSD).  What are the signs or symptoms? People with tobacco use disorder have two or more of the following signs and symptoms within 12 months:  Use of more  tobacco over a longer period than intended.  Not able to cut down or control tobacco use.  A lot of time spent obtaining or using tobacco.  Strong desire or urge to use tobacco (craving). Cravings may last for 6 months or longer after quitting.  Use of tobacco even when use leads to major problems at work, school, or home.  Use of tobacco even when use leads to relationship problems.  Giving up or cutting down on important life activities because of tobacco use.  Repeatedly using tobacco in situations where it puts you or others in physical danger, like smoking in bed.  Use of tobacco even when it is known that a physical or mental problem is likely related to tobacco use. ? Physical problems are numerous and may include chronic bronchitis, emphysema, lung and other cancers, gum disease, high blood pressure, heart disease, and stroke. ? Mental problems caused by tobacco may include difficulty sleeping and anxiety.  Need to use greater amounts of tobacco to get the same effect. This means you have developed a tolerance.  Withdrawal symptoms as a result of stopping or rapidly cutting back use. These symptoms may last a month or more after quitting and include the following: ? Depressed, anxious, or irritable mood. ? Difficulty concentrating. ? Increased appetite. ? Restlessness or trouble sleeping. ? Use of tobacco to avoid withdrawal symptoms.  How is this diagnosed? Tobacco use disorder is diagnosed by your health care provider. A diagnosis may be made by:  Your health care provider asking questions about your tobacco use and any problems it may be causing.  A physical exam.  Lab tests.  You may be referred to a mental health professional or addiction specialist.  The severity of tobacco use disorder depends on the number of signs and symptoms you have:  Mild-Two or three symptoms.  Moderate-Four or five symptoms.  Severe-Six or more symptoms.  How is this  treated? Many people with tobacco use disorder are unable to quit on their own and need help. Treatment options include the following:  Nicotine replacement therapy (NRT). NRT provides nicotine without the other harmful chemicals in tobacco. NRT gradually lowers the dosage of nicotine in the body and reduces withdrawal symptoms. NRT is available in over-the-counter forms (gum, lozenges, and skin patches) as well as prescription forms (mouth inhaler and nasal spray).  Medicines.This may include: ? Antidepressant medicine that may reduce nicotine cravings. ? A medicine that acts on nicotine receptors in the brain to reduce cravings and withdrawal symptoms. It may also block the effects of tobacco in people with TUD who relapse.  Counseling or talk therapy. A form of talk therapy called behavioral therapy is commonly used to treat people with TUD. Behavioral therapy looks at triggers for tobacco use, how to avoid them, and how to cope with cravings. It is most effective in person or by phone but is also available in self-help forms (books and Internet websites).  Support groups. These provide emotional support, advice, and guidance for quitting tobacco.  The most effective treatment for TUD is usually a combination of medicine, talk therapy, and support groups. Follow these instructions at home:  Keep all follow-up visits as directed by your health care provider. This is important.  Take medicines only as directed by your health care provider.  Check with your health care provider before starting new prescription or over-the-counter medicines. Contact a health care provider if:  You are not able to take your medicines as prescribed.  Treatment is not helping your TUD and your symptoms get worse. Get help right away if:  You have serious thoughts about hurting yourself or others.  You have trouble breathing, chest pain, sudden weakness, or sudden numbness in part of your body. This  information is not intended to replace advice given to you by your health care provider. Make sure you discuss any questions you have with your health care provider. Document Released: 06/21/2004 Document Revised: 06/18/2016 Document Reviewed: 12/12/2013 Elsevier Interactive Patient Education  2018 [employer]   Continue excellent water intake, strive for at least 125  ounces/day.   Follow Heart Healthy diet Increase regular exercise.  Recommend at least 30 minutes daily, 5 days per week of walking, jogging, biking, swimming, YouTube/Pinterest workout videos. Reduce-stop tobacco use- YOU CAN DO IT! We will call you when your lab results are available. Follow-up with Allergist as directed. Recommend annual physicals, please contact us sooner if needed. NICE TO SEE YOU!

## 2017-11-27 ENCOUNTER — Other Ambulatory Visit: Payer: Self-pay | Admitting: Adult Health

## 2017-11-27 DIAGNOSIS — E559 Vitamin D deficiency, unspecified: Secondary | ICD-10-CM

## 2017-11-27 LAB — LIPID PANEL
CHOLESTEROL TOTAL: 177 mg/dL (ref 100–199)
Chol/HDL Ratio: 4.4 ratio (ref 0.0–5.0)
HDL: 40 mg/dL (ref >39)
LDL Calculated: 116 mg/dL — ABNORMAL HIGH (ref 0–99)
Triglycerides: 103 mg/dL (ref 0–149)
VLDL Cholesterol Cal: 21 mg/dL (ref 5–40)

## 2017-11-27 LAB — CBC WITH DIFFERENTIAL/PLATELET
BASOS ABS: 0 10*3/uL (ref 0.0–0.2)
Basos: 0 %
EOS (ABSOLUTE): 0 10*3/uL (ref 0.0–0.4)
EOS: 0 %
HEMATOCRIT: 42.3 % (ref 37.5–51.0)
Hemoglobin: 14.7 g/dL (ref 13.0–17.7)
IMMATURE GRANS (ABS): 0 10*3/uL (ref 0.0–0.1)
Immature Granulocytes: 0 %
LYMPHS ABS: 1.8 10*3/uL (ref 0.7–3.1)
LYMPHS: 31 %
MCH: 30.8 pg (ref 26.6–33.0)
MCHC: 34.8 g/dL (ref 31.5–35.7)
MCV: 89 fL (ref 79–97)
MONOCYTES: 9 %
Monocytes Absolute: 0.5 10*3/uL (ref 0.1–0.9)
NEUTROS ABS: 3.5 10*3/uL (ref 1.4–7.0)
Neutrophils: 60 %
Platelets: 249 10*3/uL (ref 150–379)
RBC: 4.78 x10E6/uL (ref 4.14–5.80)
RDW: 12.8 % (ref 12.3–15.4)
WBC: 5.9 10*3/uL (ref 3.4–10.8)

## 2017-11-27 LAB — COMPREHENSIVE METABOLIC PANEL
ALT: 29 IU/L (ref 0–44)
AST: 21 IU/L (ref 0–40)
Albumin/Globulin Ratio: 2.5 — ABNORMAL HIGH (ref 1.2–2.2)
Albumin: 5 g/dL (ref 3.5–5.5)
Alkaline Phosphatase: 76 IU/L (ref 39–117)
BILIRUBIN TOTAL: 0.6 mg/dL (ref 0.0–1.2)
BUN / CREAT RATIO: 15 (ref 9–20)
BUN: 14 mg/dL (ref 6–20)
CO2: 21 mmol/L (ref 20–29)
CREATININE: 0.91 mg/dL (ref 0.76–1.27)
Calcium: 9.5 mg/dL (ref 8.7–10.2)
Chloride: 102 mmol/L (ref 96–106)
GFR calc non Af Amer: 107 mL/min/{1.73_m2} (ref >59)
GFR, EST AFRICAN AMERICAN: 124 mL/min/{1.73_m2} (ref >59)
GLOBULIN, TOTAL: 2 g/dL (ref 1.5–4.5)
GLUCOSE: 99 mg/dL (ref 65–99)
Potassium: 4.5 mmol/L (ref 3.5–5.2)
SODIUM: 141 mmol/L (ref 134–144)
TOTAL PROTEIN: 7 g/dL (ref 6.0–8.5)

## 2017-11-27 LAB — HEMOGLOBIN A1C
ESTIMATED AVERAGE GLUCOSE: 108 mg/dL
Hgb A1c MFr Bld: 5.4 % (ref 4.8–5.6)

## 2017-11-27 LAB — TSH: TSH: 5.46 u[IU]/mL — ABNORMAL HIGH (ref 0.450–4.500)

## 2017-11-27 LAB — VITAMIN D 25 HYDROXY (VIT D DEFICIENCY, FRACTURES): Vit D, 25-Hydroxy: 25.1 ng/mL — ABNORMAL LOW (ref 30.0–100.0)

## 2017-11-27 MED ORDER — VITAMIN D (ERGOCALCIFEROL) 1.25 MG (50000 UNIT) PO CAPS: 50000.0000 [IU] | ORAL_CAPSULE | ORAL | 0 refills | Status: DC

## 2017-11-30 LAB — T3, FREE: T3 FREE: 3.8 pg/mL (ref 2.0–4.4)

## 2017-11-30 LAB — SPECIMEN STATUS REPORT

## 2017-11-30 LAB — T4, FREE: Free T4: 1.02 ng/dL (ref 0.82–1.77)

## 2018-11-21 ENCOUNTER — Other Ambulatory Visit: Payer: Self-pay

## 2018-11-21 DIAGNOSIS — Z0000 Encounter for general adult medical examination without abnormal findings: Secondary | ICD-10-CM

## 2018-11-26 NOTE — Progress Notes (Signed)
Subjective:    Patient ID: Joshua Savage, male    DOB: [birth year], 38 y.o.   MRN: [id number]  HPI:  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 BMI 33.0-33.9,adult 11/28/2018  . Screening for colon cancer 11/26/2017  . Angioedema 10/02/2017  . History of food allergy 10/02/2017  . Rash in adult 08/27/2017  . Healthcare maintenance 08/27/2017  . Recurrent urticaria 08/27/2017  . BLEPHARITIS, RIGHT 01/03/2010  . ACUTE MAXILLARY SINUSITIS 04/06/2009  . Seasonal allergic rhinitis 04/06/2009     History reviewed. No pertinent past medical history.   Past Surgical History:  Procedure Laterality Date  . VASECTOMY       Family History  Problem Relation Age of Onset  . Cancer Other        colon CA  . Colon polyps Father   . Diabetes Mother   . Cancer Paternal Grandmother        breast  . Hypertension Paternal Grandmother   . Stroke Paternal Grandmother   . Cancer Paternal Grandfather        colon     Social History   Substance and Sexual Activity  Drug Use No     Social History   Substance and Sexual Activity  Alcohol Use Yes     Social History   Tobacco Use  Smoking Status Current Some Day Smoker  . Packs/day: 1.00  . Types: Cigarettes  Smokeless Tobacco Current User  . Types: Chew     Outpatient Encounter Medications as of 11/28/2018  Medication Sig  . EPINEPHrine 0.3 mg/0.3 mL IJ SOAJ injection Inject 0.3  mLs as needed into the skin.  Marland Kitchen. levocetirizine (XYZAL) 5 MG tablet Take 1 tablet (5 mg total) by mouth every evening.  . montelukast (SINGULAIR) 10 MG tablet Take 1 tablet (10 mg total) by mouth at bedtime.  . [DISCONTINUED] fluticasone (FLONASE) 50 MCG/ACT nasal spray Place 2 sprays into both nostrils daily.  . [DISCONTINUED] loratadine (CLARITIN) 10 MG tablet Take 10 mg by mouth daily.  . [DISCONTINUED] Vitamin D, Ergocalciferol, (DRISDOL) 50000 units CAPS capsule Take 1 capsule (50,000 Units total) by mouth every 7 (seven) days.   No facility-administered encounter medications on file as of 11/28/2018.     Allergies: Patient has no known allergies.  Body mass index is 33.37 kg/m.  Blood pressure 132/73, pulse (!) 59, temperature 98.5 F (36.9 C), temperature source Oral, height 6\' 2"  (1.88 m), weight 259 lb 14.4 oz (117.9 kg).     Review of Systems  Constitutional: Positive for fatigue. Negative for activity change, appetite change, chills, diaphoresis, fever and unexpected weight change.  HENT: Negative for congestion.   Eyes: Negative for visual disturbance.  Respiratory: Negative for cough, chest tightness, shortness of breath, wheezing and stridor.   Cardiovascular: Negative for chest pain, palpitations and leg swelling.  Gastrointestinal: Negative for abdominal distention, abdominal pain, blood in stool, constipation, diarrhea, nausea and vomiting.  Endocrine:  Negative for cold intolerance, heat intolerance, polydipsia, polyphagia and polyuria.  Genitourinary: Negative for difficulty urinating, dysuria, flank pain and frequency.  Musculoskeletal: Negative for arthralgias, back pain, gait problem, joint swelling, myalgias, neck pain and neck stiffness.  Skin: Negative for color change, pallor, rash and wound.  Neurological: Negative for dizziness and headaches.  Hematological: Does not bruise/bleed easily.  Psychiatric/Behavioral: Negative for agitation, behavioral problems,  confusion, decreased concentration, dysphoric mood, hallucinations, self-injury, sleep disturbance and suicidal ideas. The patient is not nervous/anxious and is not hyperactive.        Objective:   Physical Exam Vitals signs and nursing note reviewed.  Constitutional:      General: He is not in acute distress.    Appearance: Normal appearance. He is obese. He is not ill-appearing, toxic-appearing or diaphoretic.  HENT:     Head: Normocephalic and atraumatic.     Right Ear: Tympanic membrane, ear canal and external ear normal. There is no impacted cerumen.     Left Ear: Tympanic membrane, ear canal and external ear normal.     Nose: Nose normal.     Mouth/Throat:     Mouth: Mucous membranes are moist.     Pharynx: No oropharyngeal exudate or posterior oropharyngeal erythema.  Eyes:     General: No scleral icterus.       Right eye: No discharge.        Left eye: No discharge.     Extraocular Movements: Extraocular movements intact.     Conjunctiva/sclera: Conjunctivae normal.     Pupils: Pupils are equal, round, and reactive to light.  Neck:     Musculoskeletal: Normal range of motion.  Cardiovascular:     Rate and Rhythm: Normal rate.     Heart sounds: Normal heart sounds. No murmur. No friction rub. No gallop.   Pulmonary:     Effort: Pulmonary effort is normal. No respiratory distress.     Breath sounds: Normal breath sounds. No stridor. No wheezing, rhonchi or rales.  Chest:     Chest wall: No tenderness.  Abdominal:     General: Bowel sounds are normal. There is no distension.     Palpations: Abdomen is soft. There is no mass.     Tenderness: There is no abdominal tenderness. There is no right CVA tenderness, left CVA tenderness, guarding or rebound.     Hernia: No hernia is present.  Genitourinary:    Comments: Declined DRE/genital examination  Musculoskeletal: Normal range of motion.  Lymphadenopathy:     Cervical: No cervical adenopathy.  Skin:    General: Skin is  warm.     Capillary Refill: Capillary refill takes less than 2 seconds.  Neurological:     Mental Status: He is alert and oriented to person, place, and time.  Psychiatric:        Mood and Affect: Mood normal.        Behavior: Behavior normal.        Thought Content: Thought content normal.        Judgment: Judgment normal.       Assessment & Plan:   1. Vitamin D deficiency   2. Healthcare maintenance   3. BMI 33.0-33.9,adult     Healthcare maintenance Continue to drink plenty of water. Follow Mediterranean diet Increase regular exercise.  Recommend at least 30 minutes daily, 5 days per week of walking, jogging, biking, swimming, YouTube/Pinterest workout videos. We will call you when lab results are available. Try to reduce-stop Dip use- YOU  CAN DO IT! Recommend annual physical with fasting labs.  BMI 33.0-33.9,adult Body mass index is 33.37 kg/m. Current wt 259 Follow Mediterranean diet Increase regular exercise.  Recommend at least 30 minutes daily, 5 days per week of walking, jogging, biking, swimming, YouTube/Pinterest workout videos.     FOLLOW-UP:  Return in about 1 year (around 11/29/2019) for Fasting Labs, CPE.

## 2018-11-28 ENCOUNTER — Encounter: Payer: Self-pay | Admitting: Adult Health

## 2018-11-28 ENCOUNTER — Ambulatory Visit (INDEPENDENT_AMBULATORY_CARE_PROVIDER_SITE_OTHER): Payer: BLUE CROSS/BLUE SHIELD | Admitting: Adult Health

## 2018-11-28 VITALS — BP 132/73 | HR 59 | Temp 98.5°F | Ht 74.0 in | Wt 259.9 lb

## 2018-11-28 DIAGNOSIS — E559 Vitamin D deficiency, unspecified: Secondary | ICD-10-CM | POA: Diagnosis not present

## 2018-11-28 DIAGNOSIS — Z0000 Encounter for general adult medical examination without abnormal findings: Secondary | ICD-10-CM | POA: Diagnosis not present

## 2018-11-28 DIAGNOSIS — Z6833 Body mass index (BMI) 33.0-33.9, adult: Secondary | ICD-10-CM | POA: Insufficient documentation

## 2018-11-28 NOTE — Assessment & Plan Note (Signed)
Continue to drink plenty of water. Follow Mediterranean diet Increase regular exercise.  Recommend at least 30 minutes daily, 5 days per week of walking, jogging, biking, swimming, YouTube/Pinterest workout videos. We will call you when lab results are available. Try to reduce-stop Dip use- YOU CAN DO IT! Recommend annual physical with fasting labs.

## 2018-11-28 NOTE — Patient Instructions (Addendum)
Preventive Care for Adults, Male A healthy lifestyle and preventive care can promote health and wellness. Preventive health guidelines for men include the following key practices:  A routine yearly physical is a good way to check with your health care provider about your health and preventative screening. It is a chance to share any concerns and updates on your health and to receive a thorough exam.  Visit your dentist for a routine exam and preventative care every 6 months. Brush your teeth twice a day and floss once a day. Good oral hygiene prevents tooth decay and gum disease.  The frequency of eye exams is based on your age, health, family medical history, use of contact lenses, and other factors. Follow your health care provider's recommendations for frequency of eye exams.  Eat a healthy diet. Foods such as vegetables, fruits, whole grains, low-fat dairy products, and lean protein foods contain the nutrients you need without too many calories. Decrease your intake of foods high in solid fats, added sugars, and salt. Eat the right amount of calories for you.Get information about a proper diet from your health care provider, if necessary.  Regular physical exercise is one of the most important things you can do for your health. Most adults should get at least 150 minutes of moderate-intensity exercise (any activity that increases your heart rate and causes you to sweat) each week. In addition, most adults need muscle-strengthening exercises on 2 or more days a week.  Maintain a healthy weight. The body mass index (BMI) is a screening tool to identify possible weight problems. It provides an estimate of body fat based on height and weight. Your health care provider can find your BMI and can help you achieve or maintain a healthy weight.For adults 20 years and older:  A BMI below 18.5 is considered underweight.  A BMI of 18.5 to 24.9 is normal.  A BMI of 25 to 29.9 is considered  overweight.  A BMI of 30 and above is considered obese.  Maintain normal blood lipids and cholesterol levels by exercising and minimizing your intake of saturated fat. Eat a balanced diet with plenty of fruit and vegetables. Blood tests for lipids and cholesterol should begin at age 22 and be repeated every 5 years. If your lipid or cholesterol levels are high, you are over 50, or you are at high risk for heart disease, you may need your cholesterol levels checked more frequently.Ongoing high lipid and cholesterol levels should be treated with medicines if diet and exercise are not working.  If you smoke, find out from your health care provider how to quit. If you do not use tobacco, do not start.  Lung cancer screening is recommended for adults aged 87-80 years who are at high risk for developing lung cancer because of a history of smoking. A yearly low-dose CT scan of the lungs is recommended for people who have at least a 30-pack-year history of smoking and are a current smoker or have quit within the past 15 years. A pack year of smoking is smoking an average of 1 pack of cigarettes a day for 1 year (for example: 1 pack a day for 30 years or 2 packs a day for 15 years). Yearly screening should continue until the smoker has stopped smoking for at least 15 years. Yearly screening should be stopped for people who develop a health problem that would prevent them from having lung cancer treatment.  If you choose to drink alcohol, do not have more  than 2 drinks per day. One drink is considered to be 12 ounces (355 mL) of beer, 5 ounces (148 mL) of wine, or 1.5 ounces (44 mL) of liquor.  Avoid use of street drugs. Do not share needles with anyone. Ask for help if you need support or instructions about stopping the use of drugs.  High blood pressure causes heart disease and increases the risk of stroke. Your blood pressure should be checked at least every 1-2 years. Ongoing high blood pressure should be  treated with medicines, if weight loss and exercise are not effective.  If you are 34-90 years old, ask your health care provider if you should take aspirin to prevent heart disease.  Diabetes screening is done by taking a blood sample to check your blood glucose level after you have not eaten for a certain period of time (fasting). If you are not overweight and you do not have risk factors for diabetes, you should be screened once every 3 years starting at age 35. If you are overweight or obese and you are 70-84 years of age, you should be screened for diabetes every year as part of your cardiovascular risk assessment.  Colorectal cancer can be detected and often prevented. Most routine colorectal cancer screening begins at the age of 18 and continues through age 69. However, your health care provider may recommend screening at an earlier age if you have risk factors for colon cancer. On a yearly basis, your health care provider may provide home test kits to check for hidden blood in the stool. Use of a small camera at the end of a tube to directly examine the colon (sigmoidoscopy or colonoscopy) can detect the earliest forms of colorectal cancer. Talk to your health care provider about this at age 71, when routine screening begins. Direct exam of the colon should be repeated every 5-10 years through age 18, unless early forms of precancerous polyps or small growths are found.  People who are at an increased risk for hepatitis B should be screened for this virus. You are considered at high risk for hepatitis B if:  You were born in a country where hepatitis B occurs often. Talk with your health care provider about which countries are considered high risk.  Your parents were born in a high-risk country and you have not received a shot to protect against hepatitis B (hepatitis B vaccine).  You have HIV or AIDS.  You use needles to inject street drugs.  You live with, or have sex with, someone who  has hepatitis B.  You are a man who has sex with other men (MSM).  You get hemodialysis treatment.  You take certain medicines for conditions such as cancer, organ transplantation, and autoimmune conditions.  Hepatitis C blood testing is recommended for all people born from [birth year] through 1965 and any individual with known risks for hepatitis C.  Practice safe sex. Use condoms and avoid high-risk sexual practices to reduce the spread of sexually transmitted infections (STIs). STIs include gonorrhea, chlamydia, syphilis, trichomonas, herpes, HPV, and human immunodeficiency virus (HIV). Herpes, HIV, and HPV are viral illnesses that have no cure. They can result in disability, cancer, and death.  If you are a man who has sex with other men, you should be screened at least once per year for:  HIV.  Urethral, rectal, and pharyngeal infection of gonorrhea, chlamydia, or both.  If you are at risk of being infected with HIV, it is recommended that you take a  prescription medicine daily to prevent HIV infection. This is called preexposure prophylaxis (PrEP). You are considered at risk if:  You are a man who has sex with other men (MSM) and have other risk factors.  You are a heterosexual man, are sexually active, and are at increased risk for HIV infection.  You take drugs by injection.  You are sexually active with a partner who has HIV.  Talk with your health care provider about whether you are at high risk of being infected with HIV. If you choose to begin PrEP, you should first be tested for HIV. You should then be tested every 3 months for as long as you are taking PrEP.  A one-time screening for abdominal aortic aneurysm (AAA) and surgical repair of large AAAs by ultrasound are recommended for men ages 44 to 66 years who are current or former smokers.  Healthy men should no longer receive prostate-specific antigen (PSA) blood tests as part of routine cancer screening. Talk with your health  care provider about prostate cancer screening.  Testicular cancer screening is not recommended for adult males who have no symptoms. Screening includes self-exam, a health care provider exam, and other screening tests. Consult with your health care provider about any symptoms you have or any concerns you have about testicular cancer.  Use sunscreen. Apply sunscreen liberally and repeatedly throughout the day. You should seek shade when your shadow is shorter than you. Protect yourself by wearing long sleeves, pants, a wide-brimmed hat, and sunglasses year round, whenever you are outdoors.  Once a month, do a whole-body skin exam, using a mirror to look at the skin on your back. Tell your health care provider about new moles, moles that have irregular borders, moles that are larger than a pencil eraser, or moles that have changed in shape or color.  Stay current with required vaccines (immunizations).  Influenza vaccine. All adults should be immunized every year.  Tetanus, diphtheria, and acellular pertussis (Td, Tdap) vaccine. An adult who has not previously received Tdap or who does not know his vaccine status should receive 1 dose of Tdap. This initial dose should be followed by tetanus and diphtheria toxoids (Td) booster doses every 10 years. Adults with an unknown or incomplete history of completing a 3-dose immunization series with Td-containing vaccines should begin or complete a primary immunization series including a Tdap dose. Adults should receive a Td booster every 10 years.  Varicella vaccine. An adult without evidence of immunity to varicella should receive 2 doses or a second dose if he has previously received 1 dose.  Human papillomavirus (HPV) vaccine. Males aged 11-21 years who have not received the vaccine previously should receive the 3-dose series. Males aged 22-26 years may be immunized. Immunization is recommended through the age of 23 years for any male who has sex with males  and did not get any or all doses earlier. Immunization is recommended for any person with an immunocompromised condition through the age of 72 years if he did not get any or all doses earlier. During the 3-dose series, the second dose should be obtained 4-8 weeks after the first dose. The third dose should be obtained 24 weeks after the first dose and 16 weeks after the second dose.  Zoster vaccine. One dose is recommended for adults aged 23 years or older unless certain conditions are present.  Measles, mumps, and rubella (MMR) vaccine. Adults born before [birth year] generally are considered immune to measles and mumps. Adults born in [birth year]  or later should have 1 or more doses of MMR vaccine unless there is a contraindication to the vaccine or there is laboratory evidence of immunity to each of the three diseases. A routine second dose of MMR vaccine should be obtained at least 28 days after the first dose for students attending postsecondary schools, health care workers, or international travelers. People who received inactivated measles vaccine or an unknown type of measles vaccine during 1963-1967 should receive 2 doses of MMR vaccine. People who received inactivated mumps vaccine or an unknown type of mumps vaccine before 1979 and are at high risk for mumps infection should consider immunization with 2 doses of MMR vaccine. Unvaccinated health care workers born before [birth year] who lack laboratory evidence of measles, mumps, or rubella immunity or laboratory confirmation of disease should consider measles and mumps immunization with 2 doses of MMR vaccine or rubella immunization with 1 dose of MMR vaccine.  Pneumococcal 13-valent conjugate (PCV13) vaccine. When indicated, a person who is uncertain of his immunization history and has no record of immunization should receive the PCV13 vaccine. All adults 9 years of age and older should receive this vaccine. An adult aged 69 years or older who has certain medical  conditions and has not been previously immunized should receive 1 dose of PCV13 vaccine. This PCV13 should be followed with a dose of pneumococcal polysaccharide (PPSV23) vaccine. Adults who are at high risk for pneumococcal disease should obtain the PPSV23 vaccine at least 8 weeks after the dose of PCV13 vaccine. Adults older than 38 years of age who have normal immune system function should obtain the PPSV23 vaccine dose at least 1 year after the dose of PCV13 vaccine.  Pneumococcal polysaccharide (PPSV23) vaccine. When PCV13 is also indicated, PCV13 should be obtained first. All adults aged 79 years and older should be immunized. An adult younger than age 43 years who has certain medical conditions should be immunized. Any person who resides in a nursing home or long-term care facility should be immunized. An adult smoker should be immunized. People with an immunocompromised condition and certain other conditions should receive both PCV13 and PPSV23 vaccines. People with human immunodeficiency virus (HIV) infection should be immunized as soon as possible after diagnosis. Immunization during chemotherapy or radiation therapy should be avoided. Routine use of PPSV23 vaccine is not recommended for American Indians, [place] Natives, or people younger than 65 years unless there are medical conditions that require PPSV23 vaccine. When indicated, people who have unknown immunization and have no record of immunization should receive PPSV23 vaccine. One-time revaccination 5 years after the first dose of PPSV23 is recommended for people aged 19-64 years who have chronic kidney failure, nephrotic syndrome, asplenia, or immunocompromised conditions. People who received 1-2 doses of PPSV23 before age 70 years should receive another dose of PPSV23 vaccine at age 79 years or later if at least 5 years have passed since the previous dose. Doses of PPSV23 are not needed for people immunized with PPSV23 at or after age 55  years.  Meningococcal vaccine. Adults with asplenia or persistent complement component deficiencies should receive 2 doses of quadrivalent meningococcal conjugate (MenACWY-D) vaccine. The doses should be obtained at least 2 months apart. Microbiologists working with certain meningococcal bacteria, [employer] recruits, people at risk during an outbreak, and people who travel to or live in countries with a high rate of meningitis should be immunized. A first-year college student up through age 64 years who is living in a residence hall should receive a  dose if he did not receive a dose on or after his 16th birthday. Adults who have certain high-risk conditions should receive one or more doses of vaccine.  Hepatitis A vaccine. Adults who wish to be protected from this disease, have chronic liver disease, work with hepatitis A-infected animals, work in hepatitis A research labs, or travel to or work in countries with a high rate of hepatitis A should be immunized. Adults who were previously unvaccinated and who anticipate close contact with an international adoptee during the first 60 days after arrival in the [place] from a country with a high rate of hepatitis A should be immunized.  Hepatitis B vaccine. Adults should be immunized if they wish to be protected from this disease, are under age 34 years and have diabetes, have chronic liver disease, have had more than one sex partner in the past 6 months, may be exposed to blood or other infectious body fluids, are household contacts or sex partners of hepatitis B positive people, are clients or workers in certain care facilities, or travel to or work in countries with a high rate of hepatitis B.  Haemophilus influenzae type b (Hib) vaccine. A previously unvaccinated person with asplenia or sickle cell disease or having a scheduled splenectomy should receive 1 dose of Hib vaccine. Regardless of previous immunization, a recipient of a hematopoietic stem cell  transplant should receive a 3-dose series 6-12 months after his successful transplant. Hib vaccine is not recommended for adults with HIV infection. Preventive Service / Frequency Ages 77 to 55  Blood pressure check.** / Every 3-5 years.  Lipid and cholesterol check.** / Every 5 years beginning at age 66.  Hepatitis C blood test.** / For any individual with known risks for hepatitis C.  Skin self-exam. / Monthly.  Influenza vaccine. / Every year.  Tetanus, diphtheria, and acellular pertussis (Tdap, Td) vaccine.** / Consult your health care provider. 1 dose of Td every 10 years.  Varicella vaccine.** / Consult your health care provider.  HPV vaccine. / 3 doses over 6 months, if 45 or younger.  Measles, mumps, rubella (MMR) vaccine.** / You need at least 1 dose of MMR if you were born in 1957 or later. You may also need a second dose.  Pneumococcal 13-valent conjugate (PCV13) vaccine.** / Consult your health care provider.  Pneumococcal polysaccharide (PPSV23) vaccine.** / 1 to 2 doses if you smoke cigarettes or if you have certain conditions.  Meningococcal vaccine.** / 1 dose if you are age 81 to 79 years and a Market researcher living in a residence hall, or have one of several medical conditions. You may also need additional booster doses.  Hepatitis A vaccine.** / Consult your health care provider.  Hepatitis B vaccine.** / Consult your health care provider.  Haemophilus influenzae type b (Hib) vaccine.** / Consult your health care provider. Ages 6 to 58  Blood pressure check.** / Every year.  Lipid and cholesterol check.** / Every 5 years beginning at age 89.  Lung cancer screening. / Every year if you are aged 84-80 years and have a 30-pack-year history of smoking and currently smoke or have quit within the past 15 years. Yearly screening is stopped once you have quit smoking for at least 15 years or develop a health problem that would prevent you from having  lung cancer treatment.  Fecal occult blood test (FOBT) of stool. / Every year beginning at age 90 and continuing until age 73. You may not have to do  this test if you get a colonoscopy every 10 years.  Flexible sigmoidoscopy** or colonoscopy.** / Every 5 years for a flexible sigmoidoscopy or every 10 years for a colonoscopy beginning at age 50 and continuing until age 75.  Hepatitis C blood test.** / For all people born from 1945 through 1965 and any individual with known risks for hepatitis C.  Skin self-exam. / Monthly.  Influenza vaccine. / Every year.  Tetanus, diphtheria, and acellular pertussis (Tdap/Td) vaccine.** / Consult your health care provider. 1 dose of Td every 10 years.  Varicella vaccine.** / Consult your health care provider.  Zoster vaccine.** / 1 dose for adults aged 60 years or older.  Measles, mumps, rubella (MMR) vaccine.** / You need at least 1 dose of MMR if you were born in 1957 or later. You may also need a second dose.  Pneumococcal 13-valent conjugate (PCV13) vaccine.** / Consult your health care provider.  Pneumococcal polysaccharide (PPSV23) vaccine.** / 1 to 2 doses if you smoke cigarettes or if you have certain conditions.  Meningococcal vaccine.** / Consult your health care provider.  Hepatitis A vaccine.** / Consult your health care provider.  Hepatitis B vaccine.** / Consult your health care provider.  Haemophilus influenzae type b (Hib) vaccine.** / Consult your health care provider. Ages 65 and over  Blood pressure check.** / Every year.  Lipid and cholesterol check.**/ Every 5 years beginning at age 20.  Lung cancer screening. / Every year if you are aged 55-80 years and have a 30-pack-year history of smoking and currently smoke or have quit within the past 15 years. Yearly screening is stopped once you have quit smoking for at least 15 years or develop a health problem that would prevent you from having lung cancer treatment.  Fecal  occult blood test (FOBT) of stool. / Every year beginning at age 50 and continuing until age 75. You may not have to do this test if you get a colonoscopy every 10 years.  Flexible sigmoidoscopy** or colonoscopy.** / Every 5 years for a flexible sigmoidoscopy or every 10 years for a colonoscopy beginning at age 50 and continuing until age 75.  Hepatitis C blood test.** / For all people born from 1945 through 1965 and any individual with known risks for hepatitis C.  Abdominal aortic aneurysm (AAA) screening.** / A one-time screening for ages 65 to 75 years who are current or former smokers.  Skin self-exam. / Monthly.  Influenza vaccine. / Every year.  Tetanus, diphtheria, and acellular pertussis (Tdap/Td) vaccine.** / 1 dose of Td every 10 years.  Varicella vaccine.** / Consult your health care provider.  Zoster vaccine.** / 1 dose for adults aged 60 years or older.  Pneumococcal 13-valent conjugate (PCV13) vaccine.** / 1 dose for all adults aged 65 years and older.  Pneumococcal polysaccharide (PPSV23) vaccine.** / 1 dose for all adults aged 65 years and older.  Meningococcal vaccine.** / Consult your health care provider.  Hepatitis A vaccine.** / Consult your health care provider.  Hepatitis B vaccine.** / Consult your health care provider.  Haemophilus influenzae type b (Hib) vaccine.** / Consult your health care provider. **Family history and personal history of risk and conditions may change your health care provider's recommendations.   This information is not intended to replace advice given to you by your health care provider. Make sure you discuss any questions you have with your health care provider.   Document Released: 12/12/2001 Document Revised: 11/06/2014 Document Reviewed: 03/13/2011 Elsevier Interactive Patient Education 2016   [place] refers to food and lifestyle choices that are based on the traditions of  countries located on the [employer]. This way of eating has been shown to help prevent certain conditions and improve outcomes for people who have chronic diseases, like kidney disease and heart disease. What are tips for following this plan? Lifestyle  Cook and eat meals together with your family, when possible.  Drink enough fluid to keep your urine clear or pale yellow.  Be physically active every day. This includes: ? Aerobic exercise like running or swimming. ? Leisure activities like gardening, walking, or housework.  Get 7-8 hours of sleep each night.  If recommended by your health care provider, drink red wine in moderation. This means 1 glass a day for nonpregnant women and 2 glasses a day for men. A glass of wine equals 5 oz (150 mL). Reading food labels   Check the serving size of packaged foods. For foods such as rice and pasta, the serving size refers to the amount of cooked product, not dry.  Check the total fat in packaged foods. Avoid foods that have saturated fat or trans fats.  Check the ingredients list for added sugars, such as corn syrup. Shopping  At the grocery store, buy most of your food from the areas near the walls of the store. This includes: ? Fresh fruits and vegetables (produce). ? Grains, beans, nuts, and seeds. Some of these may be available in unpackaged forms or large amounts (in bulk). ? Fresh seafood. ? Poultry and eggs. ? Low-fat dairy products.  Buy whole ingredients instead of prepackaged foods.  Buy fresh fruits and vegetables in-season from local farmers markets.  Buy frozen fruits and vegetables in resealable bags.  If you do not have access to quality fresh seafood, buy precooked frozen shrimp or canned fish, such as tuna, salmon, or sardines.  Buy small amounts of raw or cooked vegetables, salads, or olives from the deli or salad bar at your store.  Stock your pantry so you always have certain foods on hand, such as olive  oil, canned tuna, canned tomatoes, rice, pasta, and beans. Cooking  Cook foods with extra-virgin olive oil instead of using butter or other vegetable oils.  Have meat as a side dish, and have vegetables or grains as your main dish. This means having meat in small portions or adding small amounts of meat to foods like pasta or stew.  Use beans or vegetables instead of meat in common dishes like chili or lasagna.  Experiment with different cooking methods. Try roasting or broiling vegetables instead of steaming or sauteing them.  Add frozen vegetables to soups, stews, pasta, or rice.  Add nuts or seeds for added healthy fat at each meal. You can add these to yogurt, salads, or vegetable dishes.  Marinate fish or vegetables using olive oil, lemon juice, garlic, and fresh herbs. Meal planning   Plan to eat 1 vegetarian meal one day each week. Try to work up to 2 vegetarian meals, if possible.  Eat seafood 2 or more times a week.  Have healthy snacks readily available, such as: ? Vegetable sticks with hummus. ? [place] yogurt. ? Fruit and nut trail mix.  Eat balanced meals throughout the week. This includes: ? Fruit: 2-3 servings a day ? Vegetables: 4-5 servings a day ? Low-fat dairy: 2 servings a day ? Fish, poultry, or lean meat: 1 serving a day ? Beans and legumes: 2  or more servings a week ? Nuts and seeds: 1-2 servings a day ? Whole grains: 6-8 servings a day ? Extra-virgin olive oil: 3-4 servings a day  Limit red meat and sweets to only a few servings a month What are my food choices?  Mediterranean diet ? Recommended ? Grains: Whole-grain pasta. Brown rice. Bulgar wheat. Polenta. Couscous. Whole-wheat bread. [place]. ? Vegetables: Artichokes. Beets. Broccoli. Cabbage. Carrots. Eggplant. Green beans. Chard. Kale. Spinach. Onions. Leeks. Peas. Squash. Tomatoes. Peppers. Radishes. ? Fruits: Apples. Apricots. Avocado. Berries. Bananas. Cherries. Dates. Figs. Grapes.  Lemons. Melon. Oranges. Peaches. Plums. Pomegranate. ? Meats and other protein foods: Beans. Almonds. Sunflower seeds. Pine nuts. Peanuts. [place]. Salmon. [place]all[place]. Shrimp. [place]. [place]. Clams. Oysters. Eggs. ? Dairy: Low-fat milk. Cheese. Greek yogurt. ? Beverages: Water. Red wine. Herbal tea. ? Fats and oils: Extra virgin olive oil. Avocado oil. Grape seed oil. ? Sweets and desserts: [place] yogurt with honey. Baked apples. Poached pears. Trail mix. ? Seasoning and other foods: Basil. Cilantro. Coriander. Cumin. Mint. Parsley. Sage. Rosemary. Tarragon. Garlic. Oregano. Thyme. Pepper. Balsalmic vinegar. Tahini. Hummus. Tomato sauce. Olives. Mushrooms. ? Limit these ? Grains: Prepackaged pasta or rice dishes. Prepackaged cereal with added sugar. ? Vegetables: Deep fried potatoes (french fries). ? Fruits: Fruit canned in syrup. ? Meats and other protein foods: Beef. Pork. Lamb. Poultry with skin. Hot dogs. [place] Sal[place]. ? Dairy: Ice cream. Sour cream. Whole milk. ? Beverages: Juice. Sugar-sweetened soft drinks. Beer. Liquor and spirits. ? Fats and oils: Butter. Canola oil. Vegetable oil. Beef fat (tallow). Lard. ? Sweets and desserts: Cookies. Cakes. Pies. Candy. ? Seasoning and other foods: Mayonnaise. Premade sauces and marinades. ? The items listed may not be a complete list. Talk with your dietitian about what dietary choices are right for you. Summary  The Mediterranean diet includes both food and lifestyle choices.  Eat a variety of fresh fruits and vegetables, beans, nuts, seeds, and whole grains.  Limit the amount of red meat and sweets that you eat.  Talk with your health care provider about whether it is safe for you to drink red wine in moderation. This means 1 glass a day for nonpregnant women and 2 glasses a day for men. A glass of wine equals 5 oz (150 mL). This information is not intended to replace advice given to you by your health care provider. Make sure you discuss any questions  you have with your health care provider. Document Released: 06/08/2016 Document Revised: 07/11/2016 Document Reviewed: 06/08/2016 Elsevier Interactive Patient Education  2019 [place] to drink plenty of water. Follow Mediterranean diet Increase regular exercise.  Recommend at least 30 minutes daily, 5 days per week of walking, jogging, biking, swimming, YouTube/Pinterest workout videos. We will call you when lab results are available. Try to reduce-stop Dip use- YOU CAN DO IT! Recommend annual physical with fasting labs. GREAT TO SEE YOU!

## 2018-11-28 NOTE — Assessment & Plan Note (Signed)
Body mass index is 33.37 kg/m. Current wt 259 Follow Mediterranean diet Increase regular exercise.  Recommend at least 30 minutes daily, 5 days per week of walking, jogging, biking, swimming, YouTube/Pinterest workout videos.

## 2018-11-29 LAB — CBC WITH DIFFERENTIAL/PLATELET
Basophils Absolute: 0 10*3/uL (ref 0.0–0.2)
Basos: 1 %
EOS (ABSOLUTE): 0.1 10*3/uL (ref 0.0–0.4)
EOS: 1 %
Hematocrit: 43.7 % (ref 37.5–51.0)
Hemoglobin: 15.4 g/dL (ref 13.0–17.7)
IMMATURE GRANULOCYTES: 1 %
Immature Grans (Abs): 0 10*3/uL (ref 0.0–0.1)
LYMPHS: 29 %
Lymphocytes Absolute: 1.7 10*3/uL (ref 0.7–3.1)
MCH: 31.8 pg (ref 26.6–33.0)
MCHC: 35.2 g/dL (ref 31.5–35.7)
MCV: 90 fL (ref 79–97)
MONOCYTES: 9 %
Monocytes Absolute: 0.5 10*3/uL (ref 0.1–0.9)
Neutrophils Absolute: 3.5 10*3/uL (ref 1.4–7.0)
Neutrophils: 59 %
Platelets: 274 10*3/uL (ref 150–450)
RBC: 4.85 x10E6/uL (ref 4.14–5.80)
RDW: 12.4 % (ref 11.6–15.4)
WBC: 5.8 10*3/uL (ref 3.4–10.8)

## 2018-11-29 LAB — COMPREHENSIVE METABOLIC PANEL
ALT: 39 IU/L (ref 0–44)
AST: 23 IU/L (ref 0–40)
Albumin/Globulin Ratio: 2.8 — ABNORMAL HIGH (ref 1.2–2.2)
Albumin: 5 g/dL (ref 4.0–5.0)
Alkaline Phosphatase: 78 IU/L (ref 39–117)
BUN/Creatinine Ratio: 14 (ref 9–20)
BUN: 13 mg/dL (ref 6–20)
Bilirubin Total: 0.7 mg/dL (ref 0.0–1.2)
CO2: 21 mmol/L (ref 20–29)
Calcium: 9.7 mg/dL (ref 8.7–10.2)
Chloride: 103 mmol/L (ref 96–106)
Creatinine, Ser: 0.91 mg/dL (ref 0.76–1.27)
GFR calc Af Amer: 123 mL/min/{1.73_m2} (ref >59)
GFR calc non Af Amer: 107 mL/min/{1.73_m2} (ref >59)
Globulin, Total: 1.8 g/dL (ref 1.5–4.5)
Glucose: 100 mg/dL — ABNORMAL HIGH (ref 65–99)
Potassium: 4.6 mmol/L (ref 3.5–5.2)
Sodium: 140 mmol/L (ref 134–144)
Total Protein: 6.8 g/dL (ref 6.0–8.5)

## 2018-11-29 LAB — LIPID PANEL
Chol/HDL Ratio: 4.7 ratio (ref 0.0–5.0)
Cholesterol, Total: 193 mg/dL (ref 100–199)
HDL: 41 mg/dL (ref >39)
LDL Calculated: 138 mg/dL — ABNORMAL HIGH (ref 0–99)
Triglycerides: 70 mg/dL (ref 0–149)
VLDL Cholesterol Cal: 14 mg/dL (ref 5–40)

## 2018-11-29 LAB — TSH: TSH: 3.23 u[IU]/mL (ref 0.450–4.500)

## 2018-11-29 LAB — HEMOGLOBIN A1C
Est. average glucose Bld gHb Est-mCnc: 105 mg/dL
Hgb A1c MFr Bld: 5.3 % (ref 4.8–5.6)

## 2018-11-29 LAB — VITAMIN D 25 HYDROXY (VIT D DEFICIENCY, FRACTURES): Vit D, 25-Hydroxy: 18.3 ng/mL — ABNORMAL LOW (ref 30.0–100.0)

## 2018-12-01 ENCOUNTER — Other Ambulatory Visit: Payer: Self-pay | Admitting: Adult Health

## 2018-12-01 DIAGNOSIS — E559 Vitamin D deficiency, unspecified: Secondary | ICD-10-CM

## 2018-12-01 MED ORDER — VITAMIN D (ERGOCALCIFEROL) 1.25 MG (50000 UNIT) PO CAPS: 50000.0000 [IU] | ORAL_CAPSULE | ORAL | 0 refills | Status: DC

## 2019-07-23 ENCOUNTER — Other Ambulatory Visit: Payer: Self-pay | Admitting: *Deleted

## 2019-07-23 DIAGNOSIS — Z20822 Contact with and (suspected) exposure to covid-19: Secondary | ICD-10-CM

## 2019-07-24 LAB — NOVEL CORONAVIRUS, NAA: SARS-CoV-2, NAA: NOT DETECTED

## 2019-12-02 ENCOUNTER — Encounter: Payer: BLUE CROSS/BLUE SHIELD | Admitting: Adult Health

## 2020-01-15 ENCOUNTER — Other Ambulatory Visit: Payer: Self-pay | Admitting: Podiatry

## 2020-01-15 ENCOUNTER — Ambulatory Visit: Payer: BC Managed Care – PPO | Admitting: Podiatry

## 2020-01-15 ENCOUNTER — Ambulatory Visit (INDEPENDENT_AMBULATORY_CARE_PROVIDER_SITE_OTHER): Payer: BC Managed Care – PPO

## 2020-01-15 ENCOUNTER — Encounter: Payer: BC Managed Care – PPO | Admitting: Adult Health

## 2020-01-15 ENCOUNTER — Encounter: Payer: Self-pay | Admitting: Podiatry

## 2020-01-15 ENCOUNTER — Other Ambulatory Visit: Payer: Self-pay

## 2020-01-15 DIAGNOSIS — M79671 Pain in right foot: Secondary | ICD-10-CM

## 2020-01-15 DIAGNOSIS — M778 Other enthesopathies, not elsewhere classified: Secondary | ICD-10-CM

## 2020-01-15 DIAGNOSIS — M722 Plantar fascial fibromatosis: Secondary | ICD-10-CM | POA: Diagnosis not present

## 2020-01-15 DIAGNOSIS — L989 Disorder of the skin and subcutaneous tissue, unspecified: Secondary | ICD-10-CM | POA: Diagnosis not present

## 2020-01-15 DIAGNOSIS — M79672 Pain in left foot: Secondary | ICD-10-CM

## 2020-01-16 ENCOUNTER — Encounter: Payer: Self-pay | Admitting: Podiatry

## 2020-01-16 NOTE — Progress Notes (Signed)
Subjective:  Patient ID: Joshua Savage, male    DOB: 10-23-1981,  MRN: [id number]  Chief Complaint  Patient presents with  . Foot Pain    pt is here for bil foot pain, pain is mainly in both plantar forefoots possibly a porokeratosis, pt also states he has some on the bottom of the left heel, as well the dorsal aspect of the left big toe. pain is elevated when applying pressure, pt puts pain as a 7 out of 10 on the pain scale    39 y.o. male presents with the above complaint.  Patient presents with bilateral foot pain.  He states that he is got hyperkeratotic lesion to submetatarsal 3 bilaterally that has been causing him a lot of pain and is worsened when ambulating.  He states that this started about 4 to 5 weeks ago has progressively gotten worse.  There is pinching sensation.  The pain scale 7 out of 10.  He also has secondary complaint of left heel pain that has been going for quite some time.  The pain is worse when ambulating in the morning.  Patient is experiencing post static dyskinesia related symptoms.  He has not tried anything to help alleviate both of the pains.  He has not seen anyone else for this.  He would like to know if there is any kind of treatment options are available for him.   Review of Systems: Negative except as noted in the HPI. Denies N/V/F/Ch.  No past medical history on file.  Current Outpatient Medications:  .  EPINEPHrine 0.3 mg/0.3 mL IJ SOAJ injection, Inject 0.3 mLs as needed into the skin., Disp: , Rfl:  .  levocetirizine (XYZAL) 5 MG tablet, Take 1 tablet (5 mg total) by mouth every evening., Disp: 30 tablet, Rfl: 5 .  montelukast (SINGULAIR) 10 MG tablet, Take 1 tablet (10 mg total) by mouth at bedtime., Disp: 30 tablet, Rfl: 5 .  Vitamin D, Ergocalciferol, (DRISDOL) 1.25 MG (50000 UT) CAPS capsule, Take 1 capsule (50,000 Units total) by mouth every 7 (seven) days., Disp: 16 capsule, Rfl: 0  Social History   Tobacco Use  Smoking Status Current Some  Day Smoker  . Packs/day: 1.00  . Types: Cigarettes  Smokeless Tobacco Current User  . Types: Chew    No Known Allergies Objective:  There were no vitals filed for this visit. There is no height or weight on file to calculate BMI. Constitutional Well developed. Well nourished.  Vascular Dorsalis pedis pulses palpable bilaterally. Posterior tibial pulses palpable bilaterally. Capillary refill normal to all digits.  No cyanosis or clubbing noted. Pedal hair growth normal.  Neurologic Normal speech. Oriented to person, place, and time. Epicritic sensation to light touch grossly present bilaterally.  Dermatologic Nails well groomed and normal in appearance. No open wounds. No skin lesions.  Orthopedic: Normal joint ROM without pain or crepitus bilaterally. No visible deformities. Tender to palpation at the calcaneal tuber left. No pain with calcaneal squeeze left. Ankle ROM full range of motion left. Silfverskiold Test: negative bilaterally.   Radiographs: Taken and reviewed. No acute fractures or dislocations. No evidence of stress fracture.  Plantar heel spur absent. Posterior heel spur absent.   Assessment:   1. Foot pain, right   2. Foot pain, left    Plan:  Patient was evaluated and treated and all questions answered.  Plantar Fasciitis, left - XR reviewed as above.  - Educated on icing and stretching. Instructions given.  - Injection delivered to the  plantar fascia as below. - DME: Plantar Fascial Brace - Pharmacologic management: None  Bilateral submetatarsal 3 benign skin lesion -I explained to the patient the etiology of skin lesion and various treatment options were discussed.  I believe patient will benefit from aggressive debridement using a chisel blade and a handle.  No complication was noted.  No pinpoint bleeding was noted.  Procedure: Injection Tendon/Ligament Location: Left plantar fascia at the glabrous junction; medial approach. Skin Prep:  alcohol Injectate: 0.5 cc 0.5% marcaine plain, 0.5 cc of 1% Lidocaine, 0.5 cc kenalog 10. Disposition: Patient tolerated procedure well. Injection site dressed with a band-aid.  No follow-ups on file.

## 2020-02-20 ENCOUNTER — Ambulatory Visit (INDEPENDENT_AMBULATORY_CARE_PROVIDER_SITE_OTHER): Payer: BC Managed Care – PPO | Admitting: Podiatry

## 2020-02-20 ENCOUNTER — Other Ambulatory Visit: Payer: Self-pay

## 2020-02-20 DIAGNOSIS — Q666 Other congenital valgus deformities of feet: Secondary | ICD-10-CM

## 2020-02-20 DIAGNOSIS — M722 Plantar fascial fibromatosis: Secondary | ICD-10-CM | POA: Diagnosis not present

## 2020-02-20 DIAGNOSIS — L989 Disorder of the skin and subcutaneous tissue, unspecified: Secondary | ICD-10-CM | POA: Diagnosis not present

## 2020-02-24 ENCOUNTER — Encounter: Payer: Self-pay | Admitting: Podiatry

## 2020-02-24 NOTE — Progress Notes (Signed)
Subjective:  Patient ID: Joshua Savage, male    DOB: 1980-11-06,  MRN: [id number]  Chief Complaint  Patient presents with  . Foot Pain    pt is here for right foot pain, pt states that pain is doing a lot better, pt also states that the injection he recieved last time has been helping as well.     39 y.o. male presents with the above complaint.  Patient is following up from left plantar fasciitis as well as bilateral submetatarsal 3 skin lesions.  Patient states that he is doing a lot better from the plantar fasciitis.  The one injection has helped him tremendously.  He has his pain scale 0 out of 10.  Patient states injection gave him relief right away.  He has been doing stretching etc. he has been wearing his brace he denies any other acute complaints.  He would like to discuss further treatments for the left plantar fasciitis.  He also states his submetatarsal 3 skin lesions have improved considerably.  He denies any other acute complaints.   Review of Systems: Negative except as noted in the HPI. Denies N/V/F/Ch.  No past medical history on file.  Current Outpatient Medications:  .  EPINEPHrine 0.3 mg/0.3 mL IJ SOAJ injection, Inject 0.3 mLs as needed into the skin., Disp: , Rfl:  .  levocetirizine (XYZAL) 5 MG tablet, Take 1 tablet (5 mg total) by mouth every evening., Disp: 30 tablet, Rfl: 5 .  montelukast (SINGULAIR) 10 MG tablet, Take 1 tablet (10 mg total) by mouth at bedtime., Disp: 30 tablet, Rfl: 5 .  Vitamin D, Ergocalciferol, (DRISDOL) 1.25 MG (50000 UT) CAPS capsule, Take 1 capsule (50,000 Units total) by mouth every 7 (seven) days., Disp: 16 capsule, Rfl: 0  Social History   Tobacco Use  Smoking Status Current Some Day Smoker  . Packs/day: 1.00  . Types: Cigarettes  Smokeless Tobacco Current User  . Types: Chew    No Known Allergies Objective:  There were no vitals filed for this visit. There is no height or weight on file to calculate BMI. Constitutional Well  developed. Well nourished.  Vascular Dorsalis pedis pulses palpable bilaterally. Posterior tibial pulses palpable bilaterally. Capillary refill normal to all digits.  No cyanosis or clubbing noted. Pedal hair growth normal.  Neurologic Normal speech. Oriented to person, place, and time. Epicritic sensation to light touch grossly present bilaterally.  Dermatologic Nails well groomed and normal in appearance. No open wounds. No skin lesions.  Orthopedic: Normal joint ROM without pain or crepitus bilaterally. No visible deformities. No tender to palpation at the calcaneal tuber left. No pain with calcaneal squeeze left. Ankle ROM full range of motion left. Silfverskiold Test: negative bilaterally.   Radiographs: Taken and reviewed. No acute fractures or dislocations. No evidence of stress fracture.  Plantar heel spur absent. Posterior heel spur absent.   Assessment:   1. Pes planovalgus   2. Plantar fasciitis, left   3. Benign skin lesion    Plan:  Patient was evaluated and treated and all questions answered.  Plantar Fasciitis, left - XR reviewed as above.  - Educated on icing and stretching. Instructions given.  -I will hold off on further injection for now as his pain has had complete improvement - DME: Night splint - Pharmacologic management: None  Semiflexible pes planus -I explained to the patient the pes planus deformity and its relationship with plantar fasciitis and various treatment options were discussed.  I believe patient will benefit from custom-made  orthotics to help control the hindfoot motion as well as support the arches of the foot.  I also believe the orthotics will need offloading of the submetatarsal 3 bilateral. -He will be scheduled to see Joshua Savage for custom-made orthotics with offloading of the submetatarsal 3 as well as treatment of plantar fasciitis.  Bilateral submetatarsal 3 benign skin lesion~ -I explained to the patient the etiology of skin lesion  and various treatment options were discussed.  I believe patient will benefit from aggressive debridement using a chisel blade and a handle.  No complication was noted.  No pinpoint bleeding was noted.  .  Return for Sched with Joshua Savage for [employer].

## 2020-03-01 ENCOUNTER — Ambulatory Visit (INDEPENDENT_AMBULATORY_CARE_PROVIDER_SITE_OTHER): Payer: BC Managed Care – PPO | Admitting: Orthotics

## 2020-03-01 ENCOUNTER — Other Ambulatory Visit: Payer: Self-pay

## 2020-03-01 DIAGNOSIS — M722 Plantar fascial fibromatosis: Secondary | ICD-10-CM | POA: Diagnosis not present

## 2020-03-01 DIAGNOSIS — Q666 Other congenital valgus deformities of feet: Secondary | ICD-10-CM

## 2020-03-01 NOTE — Progress Notes (Signed)

## 2020-04-05 ENCOUNTER — Other Ambulatory Visit: Payer: Self-pay

## 2020-04-05 ENCOUNTER — Ambulatory Visit: Payer: BC Managed Care – PPO | Admitting: Orthotics

## 2020-04-05 DIAGNOSIS — Q666 Other congenital valgus deformities of feet: Secondary | ICD-10-CM

## 2020-04-05 DIAGNOSIS — M722 Plantar fascial fibromatosis: Secondary | ICD-10-CM

## 2020-04-05 NOTE — Progress Notes (Signed)
Patient came in today to pick up custom made foot orthotics.  The goals were accomplished and the patient reported no dissatisfaction with said orthotics.  Patient was advised of breakin period and how to report any issues. 

## 2020-04-09 ENCOUNTER — Ambulatory Visit: Payer: BC Managed Care – PPO | Admitting: Podiatry

## 2020-04-19 ENCOUNTER — Ambulatory Visit: Payer: BC Managed Care – PPO | Admitting: Podiatry

## 2020-06-04 ENCOUNTER — Ambulatory Visit (INDEPENDENT_AMBULATORY_CARE_PROVIDER_SITE_OTHER): Payer: BC Managed Care – PPO | Admitting: Podiatry

## 2020-06-04 ENCOUNTER — Other Ambulatory Visit: Payer: Self-pay

## 2020-06-04 DIAGNOSIS — M79672 Pain in left foot: Secondary | ICD-10-CM | POA: Diagnosis not present

## 2020-06-04 DIAGNOSIS — Q666 Other congenital valgus deformities of feet: Secondary | ICD-10-CM | POA: Diagnosis not present

## 2020-06-04 DIAGNOSIS — M722 Plantar fascial fibromatosis: Secondary | ICD-10-CM | POA: Diagnosis not present

## 2020-06-06 ENCOUNTER — Encounter: Payer: Self-pay | Admitting: Podiatry

## 2020-06-06 NOTE — Progress Notes (Signed)
Subjective:  Patient ID: Joshua Savage, male    DOB: [birth year],  MRN: [id number]  Chief Complaint  Patient presents with  . Plantar Fasciitis    Pt states continued left heel pain despite orthotics, night splint and plantar fascial brace.     39 y.o. male presents with the above complaint.  Patient presents following up from plantar fasciitis for which he was treated completely however patient states that he went to go play recreational sport which resulted in injury and reaggravation of the plantar fasciitis on the left side.  Patient states that he was playing without his orthotics as well.  For which I reiterated the importance of wearing orthotics.  He denies any other acute complaints.  He would like to discuss his new treatment options.   Review of Systems: Negative except as noted in the HPI. Denies N/V/F/Ch.  No past medical history on file.  Current Outpatient Medications:  .  EPINEPHrine 0.3 mg/0.3 mL IJ SOAJ injection, Inject 0.3 mLs as needed into the skin., Disp: , Rfl:  .  levocetirizine (XYZAL) 5 MG tablet, Take 1 tablet (5 mg total) by mouth every evening., Disp: 30 tablet, Rfl: 5 .  montelukast (SINGULAIR) 10 MG tablet, Take 1 tablet (10 mg total) by mouth at bedtime., Disp: 30 tablet, Rfl: 5 .  Vitamin D, Ergocalciferol, (DRISDOL) 1.25 MG (50000 UT) CAPS capsule, Take 1 capsule (50,000 Units total) by mouth every 7 (seven) days., Disp: 16 capsule, Rfl: 0  Social History   Tobacco Use  Smoking Status Current Some Day Smoker  . Packs/day: 1.00  . Types: Cigarettes  Smokeless Tobacco Current User  . Types: Chew    No Known Allergies Objective:  There were no vitals filed for this visit. There is no height or weight on file to calculate BMI. Constitutional Well developed. Well nourished.  Vascular Dorsalis pedis pulses palpable bilaterally. Posterior tibial pulses palpable bilaterally. Capillary refill normal to all digits.  No cyanosis or clubbing  noted. Pedal hair growth normal.  Neurologic Normal speech. Oriented to person, place, and time. Epicritic sensation to light touch grossly present bilaterally.  Dermatologic Nails well groomed and normal in appearance. No open wounds. No skin lesions.  Orthopedic: Normal joint ROM without pain or crepitus bilaterally. No visible deformities. Tenderness to palpation at the calcaneal tuber left. No pain with calcaneal squeeze left. Ankle ROM full range of motion left. Silfverskiold Test: negative bilaterally.   Radiographs: Taken and reviewed. No acute fractures or dislocations. No evidence of stress fracture.  Plantar heel spur absent. Posterior heel spur absent.   Assessment:   1. Plantar fasciitis, left   2. Foot pain, left   3. Pes planovalgus    Plan:  Patient was evaluated and treated and all questions answered.  Plantar Fasciitis, left - XR reviewed as above.  - Educated on icing and stretching. Instructions given.  -Continue with a steroid injection as described below given that he had reaggravated this injury. - DME: Continue wearing night splint as well as plantar fascial braces until resolve meant - Pharmacologic management: None  Semiflexible pes planus -I explained to the patient the pes planus deformity and its relationship with plantar fasciitis and various treatment options were discussed.   -Continue wearing orthotics at all times especially when on his foot.  Patient states understanding.  Bilateral submetatarsal 3 benign skin lesion~ -I explained to the patient the etiology of skin lesion and various treatment options were discussed.  I believe patient will benefit from  aggressive debridement using a chisel blade and a handle.  No complication was noted.  No pinpoint bleeding was noted.  .  No follow-ups on file.

## 2020-06-11 ENCOUNTER — Ambulatory Visit: Payer: BC Managed Care – PPO | Admitting: Podiatry

## 2020-07-12 ENCOUNTER — Other Ambulatory Visit: Payer: Self-pay

## 2020-07-12 ENCOUNTER — Ambulatory Visit: Payer: BC Managed Care – PPO | Admitting: Podiatry

## 2020-07-12 DIAGNOSIS — M79672 Pain in left foot: Secondary | ICD-10-CM | POA: Diagnosis not present

## 2020-07-12 DIAGNOSIS — M722 Plantar fascial fibromatosis: Secondary | ICD-10-CM

## 2020-07-13 ENCOUNTER — Encounter: Payer: Self-pay | Admitting: Podiatry

## 2020-07-13 NOTE — Progress Notes (Signed)
  Subjective:  Patient ID: Joshua Savage, male    DOB: 10-15-1981,  MRN: [id number]  Chief Complaint  Patient presents with  . Foot Pain    pt is here for left foot pain, pt states that foot pain is doing alot better    39 y.o. male presents with the above complaint.  Patient presents with follow-up of left plantar fasciitis.  Patient states is doing really well.  He does not have any further pain.  He has been utilizing his orthotics which has helped a lot.  He denies any other acute complaints.   Review of Systems: Negative except as noted in the HPI. Denies N/V/F/Ch.  No past medical history on file.  Current Outpatient Medications:  .  EPINEPHrine 0.3 mg/0.3 mL IJ SOAJ injection, Inject 0.3 mLs as needed into the skin., Disp: , Rfl:  .  levocetirizine (XYZAL) 5 MG tablet, Take 1 tablet (5 mg total) by mouth every evening., Disp: 30 tablet, Rfl: 5 .  montelukast (SINGULAIR) 10 MG tablet, Take 1 tablet (10 mg total) by mouth at bedtime., Disp: 30 tablet, Rfl: 5 .  Vitamin D, Ergocalciferol, (DRISDOL) 1.25 MG (50000 UT) CAPS capsule, Take 1 capsule (50,000 Units total) by mouth every 7 (seven) days., Disp: 16 capsule, Rfl: 0  Social History   Tobacco Use  Smoking Status Current Some Day Smoker  . Packs/day: 1.00  . Types: Cigarettes  Smokeless Tobacco Current User  . Types: Chew    No Known Allergies Objective:  There were no vitals filed for this visit. There is no height or weight on file to calculate BMI. Constitutional Well developed. Well nourished.  Vascular Dorsalis pedis pulses palpable bilaterally. Posterior tibial pulses palpable bilaterally. Capillary refill normal to all digits.  No cyanosis or clubbing noted. Pedal hair growth normal.  Neurologic Normal speech. Oriented to person, place, and time. Epicritic sensation to light touch grossly present bilaterally.  Dermatologic Nails well groomed and normal in appearance. No open wounds. No skin lesions.    Orthopedic: Normal joint ROM without pain or crepitus bilaterally. No visible deformities. Tenderness to palpation at the calcaneal tuber left. No pain with calcaneal squeeze left. Ankle ROM full range of motion left. Silfverskiold Test: negative bilaterally.   Radiographs: Taken and reviewed. No acute fractures or dislocations. No evidence of stress fracture.  Plantar heel spur absent. Posterior heel spur absent.   Assessment:   No diagnosis found. Plan:  Patient was evaluated and treated and all questions answered.  Plantar Fasciitis, left -Clinically healed.  Patient will continue to do stretching exercises and wear orthotics.  Recommend stop using bracing.  I discussed with them that if any foot and ankle issues arise in the future come back and see me.  Patient states understanding.  Semiflexible pes planus -I explained to the patient the pes planus deformity and its relationship with plantar fasciitis and various treatment options were discussed.   -Continue wearing orthotics at all times especially when on his foot.  Patient states understanding.  Bilateral submetatarsal 3 benign skin lesion~ -I explained to the patient the etiology of skin lesion and various treatment options were discussed.  I believe patient will benefit from aggressive debridement using a chisel blade and a handle.  No complication was noted.  No pinpoint bleeding was noted.  .  No follow-ups on file.

## 2023-03-19 ENCOUNTER — Encounter: Payer: Self-pay | Admitting: Family Medicine

## 2023-03-19 ENCOUNTER — Ambulatory Visit: Payer: BC Managed Care – PPO | Admitting: Family Medicine

## 2023-03-19 VITALS — BP 146/84 | HR 75 | Temp 98.5°F | Ht 73.0 in | Wt 267.0 lb

## 2023-03-19 DIAGNOSIS — E7800 Pure hypercholesterolemia, unspecified: Secondary | ICD-10-CM | POA: Diagnosis not present

## 2023-03-19 DIAGNOSIS — T753XXA Motion sickness, initial encounter: Secondary | ICD-10-CM | POA: Diagnosis not present

## 2023-03-19 DIAGNOSIS — R39198 Other difficulties with micturition: Secondary | ICD-10-CM

## 2023-03-19 DIAGNOSIS — R2241 Localized swelling, mass and lump, right lower limb: Secondary | ICD-10-CM | POA: Diagnosis not present

## 2023-03-19 DIAGNOSIS — Z720 Tobacco use: Secondary | ICD-10-CM

## 2023-03-19 MED ORDER — TAMSULOSIN HCL 0.4 MG PO CAPS: 0.4000 mg | ORAL_CAPSULE | Freq: Every day | ORAL | 3 refills | Status: DC

## 2023-03-19 MED ORDER — SCOPOLAMINE 1 MG/3DAYS TD PT72: 1.0000 | MEDICATED_PATCH | TRANSDERMAL | 0 refills | Status: DC

## 2023-03-19 NOTE — Patient Instructions (Signed)
It was nice to meet you today,  I have sent in a scopolamine patch for your motions.  Patch can stay on for 3 days before removing and replacing it.  I have sent in a referral for an ultrasound of your thigh.  I sent this to [facility] imaging.  Please call them to schedule an appointment.  I have prescribed Flomax for your urinary difficulties.  I would like you to follow-up in 1 month.  If this medication is not working I may refer you to urology  I will discuss the results of your lab work at your next visit.  Have a great day,  Frederic Jericho, MD

## 2023-03-19 NOTE — Progress Notes (Signed)
New Patient Office Visit  Subjective    Patient ID: Joshua Savage, male    DOB: [birth year]  Age: 42 y.o. MRN: [id number]  CC:  Chief Complaint  Patient presents with   Establish Care    HPI Joshua Savage presents to establish care.  Previously saw "Joshua Savage" in this office a few years ago.  Does not take any prescribed medications.  Does take vitamin C and omega-3.  Has issues with motion sickness and is going on a cruise in a few weeks and was asking about scopolamine patches.  Has taken oral medications in the past for this.  Patient also has complaint of a "lump on his right thigh".  Feels like it has gotten larger in size.  Sometimes painful when he is ambulating a lot.  Has been checked for hernia previously and was told he does not have 1.  Has not noticed any discoloration or bruising at the location.  No scrotal swelling.  Patient complains of postvoid "dribbling".  Symptoms are occasional.  Does not have trouble starting his urine.  Has normal stream.  No other symptoms.  No family history of prostate cancer.     PMH: vit d def, hyperlipidemia  PSH: None  Tobacco use: chew. Has tried patches and pouches but it didn't help.  Has been using since he was 16.   Alcohol use: 12 beers/week.  Usually drinks on the weekends.   Drug use: marijuana.   Marital status: married.  Three kids.   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   Sexual hx: sexually active.        Outpatient Encounter Medications as of 03/19/2023  Medication Sig   scopolamine (TRANSDERM-SCOP) 1 MG/3DAYS Place 1 patch (1.5 mg total) onto the skin every 3 (three) days.   tamsulosin (FLOMAX) 0.4 MG CAPS capsule Take 1 capsule (0.4 mg total) by mouth daily.   EPINEPHrine 0.3 mg/0.3 mL IJ SOAJ injection Inject 0.3 mLs as needed into the skin. (Patient not taking: Reported on 03/19/2023)   levocetirizine (XYZAL) 5 MG tablet Take 1 tablet (5 mg total) by mouth every evening. (Patient not taking: Reported on 03/19/2023)    montelukast (SINGULAIR) 10 MG tablet Take 1 tablet (10 mg total) by mouth at bedtime. (Patient not taking: Reported on 03/19/2023)   Vitamin D, Ergocalciferol, (DRISDOL) 1.25 MG (50000 UT) CAPS capsule Take 1 capsule (50,000 Units total) by mouth every 7 (seven) days. (Patient not taking: Reported on 03/19/2023)   No facility-administered encounter medications on file as of 03/19/2023.    No past medical history on file.  Past Surgical History:  Procedure Laterality Date   VASECTOMY      Family History  Problem Relation Age of Onset   Cancer Other        colon CA   Colon polyps Father    Diabetes Mother    Cancer Paternal Grandmother        breast   Hypertension Paternal Grandmother    Stroke Paternal Grandmother    Cancer Paternal Grandfather        colon    Social History   Socioeconomic History   Marital status: Married    Spouse name: Not on file   Number of children: Not on file   Years of education: Not on file   Highest education level: Not on file  Occupational History   Not on file  Tobacco Use   Smoking status: Some Days    Packs/day: 1    Types: Cigarettes  Smokeless tobacco: Current    Types: Chew  Vaping Use   Vaping Use: Never used  Substance and Sexual Activity   Alcohol use: Yes   Drug use: No   Sexual activity: Yes    Birth control/protection: I.U.D., Surgical  Other Topics Concern   Not on file  Social History Narrative   Not on file   Social Determinants of Health   Financial Resource Strain: Low Risk  (03/19/2023)   Overall Financial Resource Strain (CARDIA)    Difficulty of Paying Living Expenses: Not hard at all  Food Insecurity: No Food Insecurity (03/19/2023)   Hunger Vital Sign    Worried About Running Out of Food in the Last Year: Never true    Ran Out of Food in the Last Year: Never true  Transportation Needs: No Transportation Needs (03/19/2023)   PRAPARE - Administrator, Civil Service (Medical): No    Lack of  Transportation (Non-Medical): No  Physical Activity: Sufficiently Active (03/19/2023)   Exercise Vital Sign    Days of Exercise per Week: 5 days    Minutes of Exercise per Session: 150+ min  Stress: No Stress Concern Present (03/19/2023)   [employer] of Occupational Health - Occupational Stress Questionnaire    Feeling of Stress : Not at all  Social Connections: Moderately Isolated (03/19/2023)   Social Connection and Isolation Panel [NHANES]    Frequency of Communication with Friends and Family: More than three times a week    Frequency of Social Gatherings with Friends and Family: More than three times a week    Attends Religious Services: Never    Database administrator or Organizations: No    Attends Banker Meetings: Never    Marital Status: Married  Catering manager Violence: Not At Risk (03/19/2023)   Humiliation, Afraid, Rape, and Kick questionnaire    Fear of Current or Ex-Partner: No    Emotionally Abused: No    Physically Abused: No    Sexually Abused: No    ROS      Objective    BP (!) 146/84   Pulse 75   Temp 98.5 F (36.9 C) (Oral)   Ht 6\' 1"  (1.854 m)   Wt 267 lb (121.1 kg)   SpO2 98%   BMI 35.23 kg/m   Physical Exam General: Alert, oriented HEENT: PERRLA, EOMI CV: Regular rate and rhythm Pulmonary: Lungs clear bilaterally GI: Soft, nontender MSK: Right upper anterior thigh subcutaneous discrete mass approximately 1 cm x 2.5 cm on palpation.  Minimally tender.  No skin changes.      Assessment & Plan:   Problem List Items Addressed This Visit       Other   Elevated cholesterol    Follow-up with detail      Relevant Orders   Lipid Profile   Mass of right thigh    Well-circumscribed mobile subcutaneous soft tissue mass approximately 1 cm x 2.5 cm on the right upper anterior thigh.  Possibly a lipoma versus cyst.  Will get ultrasound to help identify further.      Relevant Orders   [place] RT LOWER EXTREM LTD SOFT TISSUE NON  VASCULAR   Motion sickness    Usually uses over-the-counter medications.  Has a upcoming boat trip and wanted to try patches. - Scopolamine patches prescribed.      Smokeless tobacco use   Voiding difficulty - Primary    Complains of post micturition dribbling.  No other urinary symptoms.  No history of prostate cancer in the family.  Discussed urethral bulking.  Prescribed Flomax.  Obtain PSA.      Relevant Orders   PSA    Return in about 4 weeks (around 04/16/2023) for urinary symptoms.   Sandre Kitty, MD

## 2023-03-20 DIAGNOSIS — R2241 Localized swelling, mass and lump, right lower limb: Secondary | ICD-10-CM | POA: Insufficient documentation

## 2023-03-20 DIAGNOSIS — Z720 Tobacco use: Secondary | ICD-10-CM | POA: Insufficient documentation

## 2023-03-20 DIAGNOSIS — D179 Benign lipomatous neoplasm, unspecified: Secondary | ICD-10-CM | POA: Insufficient documentation

## 2023-03-20 DIAGNOSIS — T753XXA Motion sickness, initial encounter: Secondary | ICD-10-CM | POA: Insufficient documentation

## 2023-03-20 DIAGNOSIS — R39198 Other difficulties with micturition: Secondary | ICD-10-CM | POA: Insufficient documentation

## 2023-03-20 DIAGNOSIS — E7800 Pure hypercholesterolemia, unspecified: Secondary | ICD-10-CM | POA: Insufficient documentation

## 2023-03-20 NOTE — Assessment & Plan Note (Signed)
Well-circumscribed mobile subcutaneous soft tissue mass approximately 1 cm x 2.5 cm on the right upper anterior thigh.  Possibly a lipoma versus cyst.  Will get ultrasound to help identify further.

## 2023-03-20 NOTE — Assessment & Plan Note (Signed)
Usually uses over-the-counter medications.  Has a upcoming boat trip and wanted to try patches. - Scopolamine patches prescribed.

## 2023-03-20 NOTE — Assessment & Plan Note (Signed)
Follow-up with detail

## 2023-03-20 NOTE — Assessment & Plan Note (Signed)
Complains of post micturition dribbling.  No other urinary symptoms.  No history of prostate cancer in the family.  Discussed urethral bulking.  Prescribed Flomax.  Obtain PSA.

## 2023-03-21 LAB — LIPID PANEL
Chol/HDL Ratio: 4.4 ratio (ref 0.0–5.0)
Cholesterol, Total: 170 mg/dL (ref 100–199)
HDL: 39 mg/dL — ABNORMAL LOW (ref >39)
LDL Chol Calc (NIH): 104 mg/dL — ABNORMAL HIGH (ref 0–99)
Triglycerides: 154 mg/dL — ABNORMAL HIGH (ref 0–149)
VLDL Cholesterol Cal: 27 mg/dL (ref 5–40)

## 2023-03-21 LAB — PSA: Prostate Specific Ag, Serum: 0.3 ng/mL (ref 0.0–4.0)

## 2023-03-28 ENCOUNTER — Ambulatory Visit
Admission: RE | Admit: 2023-03-28 | Discharge: 2023-03-28 | Disposition: A | Discharge status: Home / Self Care | Payer: BC Managed Care – PPO | Source: Ambulatory Visit | Attending: Family Medicine | Admitting: Family Medicine

## 2023-03-28 DIAGNOSIS — R2241 Localized swelling, mass and lump, right lower limb: Secondary | ICD-10-CM

## 2023-04-18 ENCOUNTER — Ambulatory Visit: Payer: BC Managed Care – PPO | Admitting: Family Medicine

## 2023-04-18 ENCOUNTER — Encounter: Payer: Self-pay | Admitting: Family Medicine

## 2023-04-18 VITALS — BP 140/88 | HR 64 | Temp 98.3°F | Ht 73.0 in | Wt 259.0 lb

## 2023-04-18 DIAGNOSIS — R39198 Other difficulties with micturition: Secondary | ICD-10-CM

## 2023-04-18 DIAGNOSIS — D1723 Benign lipomatous neoplasm of skin and subcutaneous tissue of right leg: Secondary | ICD-10-CM | POA: Diagnosis not present

## 2023-04-18 NOTE — Progress Notes (Signed)
   Established Patient Office Visit  Subjective   Patient ID: Joshua Savage, male    DOB: [birth year]  Age: 42 y.o. MRN: [id number]  Chief Complaint  Patient presents with   Follow-up    HPI Patient states that since he started taking the Flomax he has been urinating more frequently.  States he urinates up to 10 times a day now when taking the medication.  When he stopped taking the medication while he was on a cruise the urination frequency went back to normal.  Patient also using the technique we described to help expel urine from the urethra as treatment for his postvoid dribbling.  Patient had a vasectomy 12 years ago.  Has never had any trauma to the groin as far as he can remember.  No urge incontinence.  Discussed possibly referring the patient to urology.  Eventually decided to stop the Flomax, continue the manual manipulation of the urethra, and hold off on any referral.  Patient states that the scopolamine patch worked excellently for his cruise.  Was not drowsy like he was with the oral motion sickness medication he took the last time.  Discussed with patient the lipoma and possible treatments.  Discussed with patient that he should call his insurance company to see if they cover lipoma removal.  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  The 10-year ASCVD risk score (Arnett DK, et al., 2019) is: 5.2%     ROS    Objective:     BP (!) 140/88   Pulse 64   Temp 98.3 F (36.8 C) (Oral)   Ht 6\' 1"  (1.854 m)   Wt 259 lb (117.5 kg)   SpO2 98%   BMI 34.17 kg/m    Physical Exam General: Alert, oriented no acute distress Pulmonary: No respiratory distress Psych: Pleasant affect  No results found for any visits on 04/18/23.        Assessment & Plan:   Lipoma of right lower extremity Assessment & Plan: Ultrasound indicated likely lipoma.  Causing patient discomfort, especially at work.   Advised patient to contact his insurance company regarding whether or not lipoma removal is covered by his insurance.  Can refer to surgery if patient desires resection.   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  Manual manipulation of the urethra below the scrotum appears to be helpful in reducing symptom.  Advised patient he can inform us if he decides he would like a urology referral in the future.  Will stop the tamsulosin for now.      Return in about 6 months (around 10/18/2023) for hld.    Sandre Kitty, MD

## 2023-04-18 NOTE — Assessment & Plan Note (Signed)
Ultrasound indicated likely lipoma.  Causing patient discomfort, especially at work.  Advised patient to contact his insurance company regarding whether or not lipoma removal is covered by his insurance.  Can refer to surgery if patient desires resection.

## 2023-04-18 NOTE — Assessment & Plan Note (Signed)
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  Manual manipulation of the urethra below the scrotum appears to be helpful in reducing symptom.  Advised patient he can inform us if he decides he would like a urology referral in the future.  Will stop the tamsulosin for now.

## 2023-04-18 NOTE — Patient Instructions (Signed)
It was nice to see you today,  We addressed the following topics today: -we will stop your tamsulosin.  To treat your post void issues continue to do the urethral milking technique we discussed.  -If you would like to see a urologist just let me know and I can send in a referral - For your lipoma, call your insurance company and ask them if they cover "lipoma removal procedures".  If they do, let us know and I can send in a referral to a surgeon.  Have a great day,  Frederic Jericho, MD

## 2023-06-27 ENCOUNTER — Ambulatory Visit: Payer: BC Managed Care – PPO | Admitting: Podiatry

## 2023-06-27 DIAGNOSIS — Q666 Other congenital valgus deformities of feet: Secondary | ICD-10-CM

## 2023-06-27 NOTE — Progress Notes (Signed)
  Subjective:  Patient ID: Joshua Savage, male    DOB: 1981/08/26,  MRN: [id number]  Chief Complaint  Patient presents with   Foot Pain    42 y.o. male presents with the above complaint.  Patient presents with bilateral pes planovalgus deformity.  He has a history of Planter fasciitis.  He states his plantar fascial pain he started noticing a little bit elevated but is essentially not bad anymore.  He is orthotics are starting to wear out as he is doing about 18,000 steps a day.  He wanted to get it evaluated.  He denies any other acute complaints.   Review of Systems: Negative except as noted in the HPI. Denies N/V/F/Ch.  No past medical history on file.  Current Outpatient Medications:    EPINEPHrine 0.3 mg/0.3 mL IJ SOAJ injection, Inject 0.3 mLs as needed into the skin. (Patient not taking: Reported on 03/19/2023), Disp: , Rfl:    levocetirizine (XYZAL) 5 MG tablet, Take 1 tablet (5 mg total) by mouth every evening. (Patient not taking: Reported on 03/19/2023), Disp: 30 tablet, Rfl: 5   montelukast (SINGULAIR) 10 MG tablet, Take 1 tablet (10 mg total) by mouth at bedtime. (Patient not taking: Reported on 03/19/2023), Disp: 30 tablet, Rfl: 5   scopolamine (TRANSDERM-SCOP) 1 MG/3DAYS, Place 1 patch (1.5 mg total) onto the skin every 3 (three) days. (Patient not taking: Reported on 04/18/2023), Disp: 10 patch, Rfl: 0   Vitamin D, Ergocalciferol, (DRISDOL) 1.25 MG (50000 UT) CAPS capsule, Take 1 capsule (50,000 Units total) by mouth every 7 (seven) days. (Patient not taking: Reported on 03/19/2023), Disp: 16 capsule, Rfl: 0  Social History   Tobacco Use  Smoking Status Some Days   Current packs/day: 1.00   Types: Cigarettes  Smokeless Tobacco Current   Types: Chew    No Known Allergies Objective:  There were no vitals filed for this visit. There is no height or weight on file to calculate BMI. Constitutional Well developed. Well nourished.  Vascular Dorsalis pedis pulses palpable  bilaterally. Posterior tibial pulses palpable bilaterally. Capillary refill normal to all digits.  No cyanosis or clubbing noted. Pedal hair growth normal.  Neurologic Normal speech. Oriented to person, place, and time. Epicritic sensation to light touch grossly present bilaterally.  Dermatologic Nails well groomed and normal in appearance. No open wounds. No skin lesions.  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  Rigid pes planovalgus deformity noted..  General   Radiographs: None Assessment:   1. Pes planovalgus    Plan:  Patient was evaluated and treated and all questions answered.  Pes planovalgus -Pes planovalgus -I explained to patient the etiology of pes planovalgus and relationship with Planter fasciitis and various treatment options were discussed.  Given patient foot structure in the setting of Planter fasciitis I believe patient will benefit from custom-made orthotics to help control the hindfoot motion support the arch of the foot and take the stress away from plantar fascial.  Patient agrees with the plan like to proceed with orthotics -Patient was casted for orthotics   No follow-ups on file.

## 2023-07-04 ENCOUNTER — Ambulatory Visit: Payer: BC Managed Care – PPO | Admitting: Podiatry

## 2023-08-02 ENCOUNTER — Ambulatory Visit: Payer: BC Managed Care – PPO

## 2023-08-02 NOTE — Progress Notes (Signed)
Patient presents today to pick up custom molded foot orthotics, diagnosed with PF and Pes planus by Dr. Allena Katz .   Orthotics were dispensed and fit was satisfactory. Reviewed instructions for break-in and wear. Written instructions given to patient.  Patient will follow up as needed.   Addison Bailey Cped, CFo, CFm

## 2023-08-06 ENCOUNTER — Emergency Department
Admission: EM | Admit: 2023-08-06 | Discharge: 2023-08-06 | Disposition: A | Discharge status: Home / Self Care | Payer: BC Managed Care – PPO | Attending: Emergency Medicine | Admitting: Emergency Medicine

## 2023-08-06 ENCOUNTER — Emergency Department: Payer: BC Managed Care – PPO

## 2023-08-06 ENCOUNTER — Encounter: Payer: Self-pay | Admitting: Emergency Medicine

## 2023-08-06 ENCOUNTER — Other Ambulatory Visit: Payer: Self-pay

## 2023-08-06 DIAGNOSIS — R1030 Lower abdominal pain, unspecified: Secondary | ICD-10-CM | POA: Diagnosis present

## 2023-08-06 DIAGNOSIS — N503 Cyst of epididymis: Secondary | ICD-10-CM | POA: Diagnosis not present

## 2023-08-06 DIAGNOSIS — N50812 Left testicular pain: Secondary | ICD-10-CM | POA: Insufficient documentation

## 2023-08-06 LAB — URINALYSIS, ROUTINE W REFLEX MICROSCOPIC
Bacteria, UA: NONE SEEN
Bilirubin Urine: NEGATIVE
Glucose, UA: NEGATIVE mg/dL
Hgb urine dipstick: NEGATIVE
Ketones, ur: NEGATIVE mg/dL
Leukocytes,Ua: NEGATIVE
Nitrite: NEGATIVE
Protein, ur: 30 mg/dL — AB
RBC / HPF: 0 RBC/hpf (ref 0–5)
Specific Gravity, Urine: 1.031 — ABNORMAL HIGH (ref 1.005–1.030)
Squamous Epithelial / HPF: 0 /[HPF] (ref 0–5)
pH: 5 (ref 5.0–8.0)

## 2023-08-06 LAB — CHLAMYDIA/NGC RT PCR (ARMC ONLY)
Chlamydia Tr: NOT DETECTED
N gonorrhoeae: NOT DETECTED

## 2023-08-06 MED ORDER — KETOROLAC TROMETHAMINE 30 MG/ML IJ SOLN
30.0000 mg | Freq: Once | INTRAMUSCULAR | Status: AC
  Administered 2023-08-06: 30 mg via INTRAMUSCULAR
  Filled 2023-08-06: qty 1

## 2023-08-06 NOTE — ED Provider Notes (Signed)
[facility] Provider Note    Event Date/Time   First MD Initiated Contact with Patient 08/06/23 [phone]     (approximate)   History   Chief Complaint Groin Pain   HPI  Joshua Savage is a 42 y.o. male with past medical history of hyperlipidemia and angioedema who presents to the ED complaining of groin pain.  Patient reports that he woke up a few hours prior to arrival with pain and tenderness around his left testicle.  He has not noticed any swelling to the area or any skin changes to his scrotum.  He has not had any fevers, dysuria, abdominal pain, flank pain, or penile discharge.  He states he is sexually active with his spouse but has not had any new sexual partners.     Physical Exam   Triage Vital Signs: ED Triage Vitals  Encounter Vitals Group     BP 08/06/23 0604 138/82     Systolic BP Percentile --      Diastolic BP Percentile --      Pulse Rate 08/06/23 0604 69     Resp 08/06/23 0604 18     Temp 08/06/23 0604 98.4 F (36.9 C)     Temp Source 08/06/23 0604 Oral     SpO2 08/06/23 0604 99 %     Weight 08/06/23 0556 240 lb (108.9 kg)     Height 08/06/23 0556 6\' 1"  (1.854 m)     Head Circumference --      Peak Flow --      Pain Score 08/06/23 0556 8     Pain Loc --      Pain Education --      Exclude from Growth Chart --     Most recent vital signs: Vitals:   08/06/23 0604  BP: 138/82  Pulse: 69  Resp: 18  Temp: 98.4 F (36.9 C)  SpO2: 99%    Constitutional: Alert and oriented. Eyes: Conjunctivae are normal. Head: Atraumatic. Nose: No congestion/rhinnorhea. Mouth/Throat: Mucous membranes are moist.  Cardiovascular: Normal rate, regular rhythm. Grossly normal heart sounds.  2+ radial pulses bilaterally. Respiratory: Normal respiratory effort.  No retractions. Lungs CTAB. Gastrointestinal: Soft and nontender. No distention. Genitourinary: Tenderness to palpation around left testicle with no associated edema, erythema, or skin  lesions. Musculoskeletal: No lower extremity tenderness nor edema.  Neurologic:  Normal speech and language. No gross focal neurologic deficits are appreciated.    ED Results / Procedures / Treatments   Labs (all labs ordered are listed, but only abnormal results are displayed) Labs Reviewed  URINALYSIS, ROUTINE W REFLEX MICROSCOPIC - Abnormal; Notable for the following components:      Result Value   Color, Urine AMBER (*)    APPearance CLOUDY (*)    Specific Gravity, Urine 1.031 (*)    Protein, ur 30 (*)    All other components within normal limits  CHLAMYDIA/NGC RT PCR ([facility] ONLY)             RADIOLOGY Scrotal ultrasound reviewed and interpreted by me with no evidence of torsion.  PROCEDURES:  Critical Care performed: No  Procedures   MEDICATIONS ORDERED IN ED: Medications  ketorolac (TORADOL) 30 MG/ML injection 30 mg (30 mg Intramuscular Given 08/06/23 0817)     IMPRESSION / MDM / ASSESSMENT AND PLAN / ED COURSE  I reviewed the triage vital signs and the nursing notes.  42 y.o. male with past medical history of hyperlipidemia and angioedema who presents to the ED complaining of pain around his left testicle since waking up a few hours prior to arrival.  Patient's presentation is most consistent with acute presentation with potential threat to life or bodily function.  Differential diagnosis includes, but is not limited to, testicular torsion, cellulitis, abscess, inguinal hernia, epididymitis.  Patient well-appearing and in no acute distress, vital signs are unremarkable.  Urinalysis shows no signs of infection, STD testing is pending at this time but overall low suspicion for sexually transmitted infection.  Scrotal ultrasound shows no evidence of torsion, does show small hydrocele and epididymal cyst on the left which could be contributing to his pain.  Patient appropriate for outpatient management, pain improved following dose of IM  Toradol.  He was provided referral to follow-up with urology as needed, otherwise counseled to return to the ED for new or worsening symptoms.  Patient agrees with plan.      FINAL CLINICAL IMPRESSION(S) / ED DIAGNOSES   Final diagnoses:  Pain in left testicle  Epididymal cyst     Rx / DC Orders   ED Discharge Orders     None        Note:  This document was prepared using Dragon voice recognition software and may include unintentional dictation errors.   Chesley Noon, MD 08/06/23 [phone]

## 2023-08-06 NOTE — ED Notes (Signed)
See triage note  Presents with pain to left testicle pain   States he noticed some swelling to that side Denies any trauma

## 2023-08-06 NOTE — ED Triage Notes (Signed)
Patient reports woke this morning with left groin pain.  Denies injury or swelling.

## 2023-10-12 ENCOUNTER — Encounter: Payer: Self-pay | Admitting: Family Medicine

## 2023-10-12 ENCOUNTER — Ambulatory Visit: Payer: BC Managed Care – PPO | Admitting: Family Medicine

## 2023-10-12 VITALS — BP 131/76 | HR 64 | Temp 98.6°F | Ht 72.0 in | Wt 248.0 lb

## 2023-10-12 DIAGNOSIS — M7502 Adhesive capsulitis of left shoulder: Secondary | ICD-10-CM

## 2023-10-12 DIAGNOSIS — R809 Proteinuria, unspecified: Secondary | ICD-10-CM | POA: Insufficient documentation

## 2023-10-12 DIAGNOSIS — E7800 Pure hypercholesterolemia, unspecified: Secondary | ICD-10-CM

## 2023-10-12 DIAGNOSIS — M7500 Adhesive capsulitis of unspecified shoulder: Secondary | ICD-10-CM | POA: Insufficient documentation

## 2023-10-12 DIAGNOSIS — R030 Elevated blood-pressure reading, without diagnosis of hypertension: Secondary | ICD-10-CM | POA: Insufficient documentation

## 2023-10-12 NOTE — Assessment & Plan Note (Signed)
Patient has lost 10 pounds since last time I saw him.  Has cut out some junk food.  Will recheck cholesterol levels today.

## 2023-10-12 NOTE — Progress Notes (Signed)
Established Patient Office Visit  Subjective   Patient ID: Joshua Savage, male    DOB: [birth year]  Age: 42 y.o. MRN: [id number]  Chief Complaint  Patient presents with   Hyperlipidemia    HPI  Lipoma-patient has not discussed coverage of lipoma resection with his insurance company.  Does not plan to have it removed at this time.  Hyperlipidemia- since the last time I saw him states he gained approximately 20 pounds, but then lost a significant amount.  Currently he is 10 pounds lighter than his weight when I saw him last in the summer.  He has been drinking more water and cutting out junk food like honey buns foods he gets from the gas station.  Proteinuria-patient had a urine test performed as part of workup of testicular pain in the emergency department in October.  This showed protein.  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  We discussed causes of protein.  Patient's son has a autoimmune kidney issue but he does not know the name of it.  Testicular pain-patient's testicular pain has resolved.  He woke up feeling like he had "been kicked in the nuts" the day he may to the ED.  We discussed his ultrasound findings of the small hydrocele.  Advised to let us know if he develops testicular pain again.  Hypertension-patient's blood pressure is elevated again today.  We discussed blood pressure medications.  Patient agreeable to checking his blood pressure at home and documenting values and bring those back to [place] or sending in the values through MyChart.  Adhesive capsulitis-patient has had treatments for adhesive capsulitis with [facility].  Still has some pain in the shoulder.  Exacerbated by the fact that he has to lift heavy objects as part of his job and works 60 hours a week.  The 10-year ASCVD risk score (Arnett DK, et al., 2019) is: 4.7%  Health Maintenance Due  Topic Date Due   HIV Screening  Never done   Hepatitis C Screening   Never done   COVID-19 Vaccine (1 - 2024-25 season) Never done      Objective:     BP 131/76   Pulse 64   Temp 98.6 F (37 C) (Oral)   Ht 6' (1.829 m)   Wt 248 lb 0.6 oz (112.5 kg)   SpO2 99%   BMI 33.64 kg/m    Physical Exam General: Alert, oriented Pulmonary: No respiratory distress Psych: Pleasant affect   No results found for any visits on 10/12/23.      Assessment & Plan:   Elevated cholesterol Assessment & Plan: Patient has lost 10 pounds since last time I saw him.  Has cut out some junk food.  Will recheck cholesterol levels today.  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  The only value I have documented is from his ED visit 2 months ago.  Will get urine microscopy and urine albumin creatinine ratio.  Further workup pending results.  His son has an autoimmune kidney disease but he does not know which one.  May need referral to nephrology  Orders: -     Microalbumin / creatinine urine ratio -     Urine Microscopic  Elevated blood pressure reading Assessment & Plan: Initial value was elevated.  Repeat was normal.  Will have patient check his  blood pressure at home and provide [place] with the values so we can discuss his readings at next visit.   Adhesive capsulitis of left shoulder Assessment & Plan: Left shoulder adhesive capsulitis is being treated at [facility].  He is also doing exercises and stretching at home.  Recovery is limited by the fact that he has to lift heavy objects at work on an almost daily basis.      Return in about 6 weeks (around 11/23/2023) for HTN.    Sandre Kitty, MD

## 2023-10-12 NOTE — Assessment & Plan Note (Signed)
Initial value was elevated.  Repeat was normal.  Will have patient check his blood pressure at home and provide [place] with the values so we can discuss his readings at next visit.

## 2023-10-12 NOTE — Assessment & Plan Note (Signed)
Left shoulder adhesive capsulitis is being treated at [facility].  He is also doing exercises and stretching at home.  Recovery is limited by the fact that he has to lift heavy objects at work on an almost daily basis.

## 2023-10-12 NOTE — Patient Instructions (Signed)
It was nice to see you today,  We addressed the following topics today: -I will follow-up with you regarding your urine test and your blood test when I get the results - I would like you to check your blood pressure at home and document the recordings on the blood pressure log I have provided in this handout.  Once you have at least a weeks worth of values you can send [place] the results through MyChart or bring back the sheet to our office  Have a great day,  Frederic Jericho, MD

## 2023-10-12 NOTE — Assessment & Plan Note (Signed)
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  The only value I have documented is from his ED visit 2 months ago.  Will get urine microscopy and urine albumin creatinine ratio.  Further workup pending results.  His son has an autoimmune kidney disease but he does not know which one.  May need referral to nephrology

## 2023-10-13 LAB — URINALYSIS, MICROSCOPIC ONLY
Bacteria, UA: NONE SEEN
Casts: NONE SEEN /[LPF]
Epithelial Cells (non renal): NONE SEEN /[HPF] (ref 0–10)
RBC, Urine: NONE SEEN /[HPF] (ref 0–2)
WBC, UA: NONE SEEN /[HPF] (ref 0–5)

## 2023-10-14 LAB — LIPID PANEL
Chol/HDL Ratio: 3.7 {ratio} (ref 0.0–5.0)
Cholesterol, Total: 187 mg/dL (ref 100–199)
HDL: 50 mg/dL (ref >39)
LDL Chol Calc (NIH): 117 mg/dL — ABNORMAL HIGH (ref 0–99)
Triglycerides: 109 mg/dL (ref 0–149)
VLDL Cholesterol Cal: 20 mg/dL (ref 5–40)

## 2023-10-14 LAB — MICROALBUMIN / CREATININE URINE RATIO
Creatinine, Urine: 97.1 mg/dL
Microalb/Creat Ratio: 35 mg/g{creat} — ABNORMAL HIGH (ref 0–29)
Microalbumin, Urine: 34.2 ug/mL

## 2023-10-15 ENCOUNTER — Encounter: Payer: Self-pay | Admitting: Family Medicine

## 2023-10-15 ENCOUNTER — Other Ambulatory Visit: Payer: Self-pay | Admitting: Family Medicine

## 2023-10-15 DIAGNOSIS — R809 Proteinuria, unspecified: Secondary | ICD-10-CM

## 2023-11-23 ENCOUNTER — Ambulatory Visit: Payer: BC Managed Care – PPO | Admitting: Family Medicine

## 2023-11-23 ENCOUNTER — Encounter: Payer: Self-pay | Admitting: Family Medicine

## 2023-11-23 VITALS — BP 119/78 | HR 52 | Ht 72.0 in | Wt 242.1 lb

## 2023-11-23 DIAGNOSIS — Z6833 Body mass index (BMI) 33.0-33.9, adult: Secondary | ICD-10-CM

## 2023-11-23 DIAGNOSIS — R809 Proteinuria, unspecified: Secondary | ICD-10-CM

## 2023-11-23 DIAGNOSIS — E559 Vitamin D deficiency, unspecified: Secondary | ICD-10-CM | POA: Insufficient documentation

## 2023-11-23 DIAGNOSIS — R030 Elevated blood-pressure reading, without diagnosis of hypertension: Secondary | ICD-10-CM

## 2023-11-23 NOTE — Assessment & Plan Note (Signed)
Mild proteinuria seen on UACR.  We clarified that it is actually his stepson, not biological son that has the autoimmune kidney issues.  Patient agreeable to further workup.  Getting CMP, CBC and ANA

## 2023-11-23 NOTE — Progress Notes (Signed)
   Established Patient Office Visit  Subjective   Patient ID: Joshua Savage, male    DOB: [birth year]  Age: 43 y.o. MRN: [id number]  Chief Complaint  Patient presents with   Medical Management of Chronic Issues    HPI  Hypertension-patient has been checking his blood pressures at home and they were normal.    Proteinuria-patient clarifies that his son is actually his stepson.  Does not have any genetic relatives with proteinuric kidney disease  Weight-patient still losing weight.  Not doing any specific diet, just attributed it to not having enough time to eat a lot.  Works over 60 hours a week.  Occasionally snacks at work but otherwise eats what his wife makes for dinner.  Patient states that he was told he had low vitamin D in the past and took vitamin D supplementation but not recently.  The 10-year ASCVD risk score (Arnett DK, et al., 2019) is: 3.7%  Health Maintenance Due  Topic Date Due   Pneumococcal Vaccine 76-61 Years old (1 of 2 - PCV) Never done   HIV Screening  Never done   Hepatitis C Screening  Never done   COVID-19 Vaccine (1 - 2024-25 season) Never done      Objective:     BP 119/78   Pulse (!) 52   Ht 6' (1.829 m)   Wt 242 lb 1.9 oz (109.8 kg)   SpO2 98%   BMI 32.84 kg/m    Physical Exam General: Alert, oriented Pulmonary: No respiratory distress Psych: Pleasant affect   No results found for any visits on 11/23/23.      Assessment & Plan:   Vitamin D deficiency Assessment & Plan: Less than 20 when it was last checked in 2020.  Rechecking today.  Discussed daily versus weekly dosing.  Patient agreeable to either.  Orders: -     VITAMIN D 25 Hydroxy (Vit-D Deficiency, Fractures)  Proteinuria, unspecified type Assessment & Plan: Mild proteinuria seen on UACR.  We clarified that it is actually his stepson, not biological son that has the autoimmune kidney issues.  Patient agreeable to further workup.  Getting CMP, CBC and ANA  Orders: -      CBC -     Comprehensive metabolic panel -     ANA w/Reflex if Positive  Elevated blood pressure reading Assessment & Plan: Normal when he rechecked it at home and normal today.  No further workup needed and no medications needed at this time.   BMI 33.0-33.9,adult Assessment & Plan: Patient continuing to lose weight, although this is more likely due to his excessive work schedule and not having time to eat a lot of food.  Discussed healthy food options such as fresh fruits when snacking at work.  Discussed limiting saturated fats.      Return in about 6 months (around 05/22/2024) for physical.    Sandre Kitty, MD

## 2023-11-23 NOTE — Patient Instructions (Signed)
It was nice to see you today,  We addressed the following topics today: I am going to get some additional blood test to check for causes of your proteinuria.  I am also getting a check of your vitamin D. - If we need to do further workup I will let you know - Continue trying to lose weight and eat healthy.  If we need a snack at work try something that is low calorie and healthy like an orange or a banana.  Also try to limit your saturated fat intake if you are trying to lower your cholesterol level. - When I see you again in 6 months we can recheck your cholesterol levels.  Have a great day,  Frederic Jericho, MD

## 2023-11-23 NOTE — Assessment & Plan Note (Signed)
Normal when he rechecked it at home and normal today.  No further workup needed and no medications needed at this time.

## 2023-11-23 NOTE — Assessment & Plan Note (Signed)
Less than 20 when it was last checked in 2020.  Rechecking today.  Discussed daily versus weekly dosing.  Patient agreeable to either.

## 2023-11-23 NOTE — Assessment & Plan Note (Signed)
Patient continuing to lose weight, although this is more likely due to his excessive work schedule and not having time to eat a lot of food.  Discussed healthy food options such as fresh fruits when snacking at work.  Discussed limiting saturated fats.

## 2023-11-24 LAB — CBC
Hematocrit: 42.8 % (ref 37.5–51.0)
Hemoglobin: 14.6 g/dL (ref 13.0–17.7)
MCH: 32.2 pg (ref 26.6–33.0)
MCHC: 34.1 g/dL (ref 31.5–35.7)
MCV: 94 fL (ref 79–97)
Platelets: 251 10*3/uL (ref 150–450)
RBC: 4.54 x10E6/uL (ref 4.14–5.80)
RDW: 11.7 % (ref 11.6–15.4)
WBC: 6.1 10*3/uL (ref 3.4–10.8)

## 2023-11-24 LAB — COMPREHENSIVE METABOLIC PANEL
ALT: 26 [IU]/L (ref 0–44)
AST: 18 [IU]/L (ref 0–40)
Albumin: 4.8 g/dL (ref 4.1–5.1)
Alkaline Phosphatase: 69 [IU]/L (ref 44–121)
BUN/Creatinine Ratio: 15 (ref 9–20)
BUN: 13 mg/dL (ref 6–24)
Bilirubin Total: 0.8 mg/dL (ref 0.0–1.2)
CO2: 21 mmol/L (ref 20–29)
Calcium: 9.3 mg/dL (ref 8.7–10.2)
Chloride: 103 mmol/L (ref 96–106)
Creatinine, Ser: 0.86 mg/dL (ref 0.76–1.27)
Globulin, Total: 1.9 g/dL (ref 1.5–4.5)
Glucose: 93 mg/dL (ref 70–99)
Potassium: 4.6 mmol/L (ref 3.5–5.2)
Sodium: 141 mmol/L (ref 134–144)
Total Protein: 6.7 g/dL (ref 6.0–8.5)
eGFR: 110 mL/min/{1.73_m2} (ref >59)

## 2023-11-24 LAB — VITAMIN D 25 HYDROXY (VIT D DEFICIENCY, FRACTURES): Vit D, 25-Hydroxy: 19.5 ng/mL — ABNORMAL LOW (ref 30.0–100.0)

## 2023-11-24 LAB — ANA W/REFLEX IF POSITIVE: Anti Nuclear Antibody (ANA): NEGATIVE

## 2023-11-26 ENCOUNTER — Encounter: Payer: Self-pay | Admitting: Family Medicine

## 2023-11-26 ENCOUNTER — Other Ambulatory Visit: Payer: Self-pay | Admitting: Family Medicine

## 2023-11-26 MED ORDER — VITAMIN D (ERGOCALCIFEROL) 1.25 MG (50000 UNIT) PO CAPS: 50000.0000 [IU] | ORAL_CAPSULE | ORAL | 0 refills | Status: AC

## 2024-03-28 ENCOUNTER — Other Ambulatory Visit: Payer: Self-pay | Admitting: Family Medicine

## 2024-03-28 NOTE — Telephone Encounter (Signed)
 Copied from CRM [id number]. Topic: Clinical - Prescription Issue >> Mar 28, 2024 10:46 AM Felizardo Hotter wrote: Reason for CRM: Pt's wife called on behalf of husband pt regarding pt's motion sickness, pt needs refill for scopolamine  patch 1 mg every 3 days. [facility] - [place], [place] -  [address] [place] [place] [address] Phone: [phone] Fax: [phone]

## 2024-04-01 MED ORDER — SCOPOLAMINE 1 MG/3DAYS TD PT72: 1.0000 | MEDICATED_PATCH | TRANSDERMAL | 1 refills | Status: AC

## 2024-06-05 ENCOUNTER — Other Ambulatory Visit: Payer: Self-pay | Admitting: *Deleted

## 2024-06-05 ENCOUNTER — Other Ambulatory Visit: Payer: Self-pay

## 2024-06-05 ENCOUNTER — Telehealth: Payer: Self-pay

## 2024-06-05 DIAGNOSIS — E559 Vitamin D deficiency, unspecified: Secondary | ICD-10-CM

## 2024-06-05 DIAGNOSIS — Z131 Encounter for screening for diabetes mellitus: Secondary | ICD-10-CM

## 2024-06-05 DIAGNOSIS — R030 Elevated blood-pressure reading, without diagnosis of hypertension: Secondary | ICD-10-CM

## 2024-06-05 DIAGNOSIS — E7800 Pure hypercholesterolemia, unspecified: Secondary | ICD-10-CM

## 2024-06-05 NOTE — Telephone Encounter (Signed)
 Copied from CRM #[id number]. Topic: Clinical - Request for Lab/Test Order >> Jun 05, 2024 12:37 PM Fonda T wrote: Reason for CRM: Received all from patient spouse, Powell, [place] verified, requesting for lab orders to be printed so that patient is able to go to a freestanding [facility] due to his work shift, and office hours conflicting.  If this is ok, please contact spouse when lab orders are printed and ready to be picked up.  Can be reached at [phone].    Called pt spouse unable to LVM mail box is full if she contact the office please advised her that lab req is placed up front for pick up

## 2024-06-06 ENCOUNTER — Other Ambulatory Visit: Payer: BC Managed Care – PPO

## 2024-06-13 ENCOUNTER — Encounter: Payer: BC Managed Care – PPO | Admitting: Family Medicine
